# Patient Record
Sex: Female | Born: 1955 | Race: White | Hispanic: No | Smoking: Never smoker
Health system: Southern US, Community
[De-identification: ages and names within clinical notes are randomized; demographics above are authoritative.]

## PROBLEM LIST (undated history)

## (undated) DIAGNOSIS — K635 Polyp of colon: Secondary | ICD-10-CM

## (undated) DIAGNOSIS — J309 Allergic rhinitis, unspecified: Secondary | ICD-10-CM

## (undated) DIAGNOSIS — N814 Uterovaginal prolapse, unspecified: Secondary | ICD-10-CM

## (undated) DIAGNOSIS — R928 Other abnormal and inconclusive findings on diagnostic imaging of breast: Secondary | ICD-10-CM

## (undated) DIAGNOSIS — E7800 Pure hypercholesterolemia, unspecified: Secondary | ICD-10-CM

## (undated) DIAGNOSIS — R1031 Right lower quadrant pain: Secondary | ICD-10-CM

## (undated) DIAGNOSIS — K649 Unspecified hemorrhoids: Secondary | ICD-10-CM

## (undated) DIAGNOSIS — E041 Nontoxic single thyroid nodule: Secondary | ICD-10-CM

## (undated) DIAGNOSIS — N3020 Other chronic cystitis without hematuria: Secondary | ICD-10-CM

## (undated) DIAGNOSIS — M8580 Other specified disorders of bone density and structure, unspecified site: Secondary | ICD-10-CM

## (undated) DIAGNOSIS — N94819 Vulvodynia, unspecified: Secondary | ICD-10-CM

## (undated) HISTORY — PX: TUBAL LIGATION: SHX77

## (undated) HISTORY — DX: Other specified disorders of bone density and structure, unspecified site: M85.80

## (undated) HISTORY — DX: Other abnormal and inconclusive findings on diagnostic imaging of breast: R92.8

## (undated) HISTORY — DX: Nontoxic single thyroid nodule: E04.1

## (undated) HISTORY — DX: Pure hypercholesterolemia, unspecified: E78.00

## (undated) HISTORY — DX: Polyp of colon: K63.5

## (undated) HISTORY — DX: Vulvodynia, unspecified: N94.819

## (undated) HISTORY — DX: Allergic rhinitis, unspecified: J30.9

## (undated) HISTORY — PX: TOTAL ABDOMINAL HYSTERECTOMY: SHX209

## (undated) HISTORY — DX: Right lower quadrant pain: R10.31

## (undated) HISTORY — DX: Uterovaginal prolapse, unspecified: N81.4

## (undated) HISTORY — DX: Other chronic cystitis without hematuria: N30.20

## (undated) HISTORY — DX: Unspecified hemorrhoids: K64.9

---

## 1987-03-23 HISTORY — PX: ABDOMINAL HYSTERECTOMY: SHX81

## 1999-03-06 ENCOUNTER — Other Ambulatory Visit: Admission: RE | Admit: 1999-03-06 | Discharge: 1999-03-06 | Payer: Self-pay | Admitting: Obstetrics and Gynecology

## 1999-03-23 HISTORY — PX: RECTOVAGINAL FISTULA CLOSURE: SUR265

## 1999-07-06 ENCOUNTER — Encounter: Admission: RE | Admit: 1999-07-06 | Discharge: 1999-07-06 | Payer: Self-pay | Admitting: Obstetrics and Gynecology

## 1999-07-06 ENCOUNTER — Encounter: Payer: Self-pay | Admitting: Obstetrics and Gynecology

## 1999-09-11 ENCOUNTER — Encounter: Admission: RE | Admit: 1999-09-11 | Discharge: 1999-09-11 | Payer: Self-pay | Admitting: Gastroenterology

## 1999-09-11 ENCOUNTER — Encounter: Payer: Self-pay | Admitting: Gastroenterology

## 1999-09-21 ENCOUNTER — Encounter: Payer: Self-pay | Admitting: Gastroenterology

## 1999-09-21 ENCOUNTER — Encounter: Admission: RE | Admit: 1999-09-21 | Discharge: 1999-09-21 | Payer: Self-pay | Admitting: Gastroenterology

## 1999-11-11 ENCOUNTER — Ambulatory Visit (HOSPITAL_COMMUNITY): Admission: RE | Admit: 1999-11-11 | Discharge: 1999-11-11 | Payer: Self-pay | Admitting: Gastroenterology

## 1999-11-20 ENCOUNTER — Encounter: Admission: RE | Admit: 1999-11-20 | Discharge: 1999-11-20 | Payer: Self-pay | Admitting: Internal Medicine

## 1999-11-20 ENCOUNTER — Encounter: Payer: Self-pay | Admitting: Internal Medicine

## 1999-12-24 ENCOUNTER — Encounter (INDEPENDENT_AMBULATORY_CARE_PROVIDER_SITE_OTHER): Payer: Self-pay | Admitting: Specialist

## 1999-12-24 ENCOUNTER — Observation Stay (HOSPITAL_COMMUNITY): Admission: RE | Admit: 1999-12-24 | Discharge: 1999-12-25 | Payer: Self-pay | Admitting: General Surgery

## 2000-09-01 ENCOUNTER — Encounter: Admission: RE | Admit: 2000-09-01 | Discharge: 2000-09-01 | Payer: Self-pay | Admitting: Obstetrics and Gynecology

## 2000-09-01 ENCOUNTER — Encounter: Payer: Self-pay | Admitting: Obstetrics and Gynecology

## 2000-10-06 ENCOUNTER — Other Ambulatory Visit: Admission: RE | Admit: 2000-10-06 | Discharge: 2000-10-06 | Payer: Self-pay | Admitting: Obstetrics and Gynecology

## 2000-11-02 ENCOUNTER — Ambulatory Visit (HOSPITAL_COMMUNITY): Admission: RE | Admit: 2000-11-02 | Discharge: 2000-11-02 | Payer: Self-pay | Admitting: *Deleted

## 2000-11-04 ENCOUNTER — Ambulatory Visit (HOSPITAL_COMMUNITY): Admission: RE | Admit: 2000-11-04 | Discharge: 2000-11-04 | Payer: Self-pay | Admitting: *Deleted

## 2000-11-04 ENCOUNTER — Encounter: Payer: Self-pay | Admitting: *Deleted

## 2001-10-30 ENCOUNTER — Encounter: Payer: Self-pay | Admitting: Urology

## 2001-10-30 ENCOUNTER — Encounter: Admission: RE | Admit: 2001-10-30 | Discharge: 2001-10-30 | Payer: Self-pay | Admitting: Urology

## 2002-11-07 ENCOUNTER — Encounter: Admission: RE | Admit: 2002-11-07 | Discharge: 2002-11-07 | Payer: Self-pay | Admitting: Obstetrics and Gynecology

## 2002-11-07 ENCOUNTER — Encounter: Payer: Self-pay | Admitting: Obstetrics and Gynecology

## 2004-03-04 ENCOUNTER — Other Ambulatory Visit: Admission: RE | Admit: 2004-03-04 | Discharge: 2004-03-04 | Payer: Self-pay | Admitting: Obstetrics and Gynecology

## 2004-06-11 ENCOUNTER — Observation Stay (HOSPITAL_COMMUNITY): Admission: AD | Admit: 2004-06-11 | Discharge: 2004-06-12 | Payer: Self-pay | Admitting: *Deleted

## 2004-06-19 ENCOUNTER — Encounter (INDEPENDENT_AMBULATORY_CARE_PROVIDER_SITE_OTHER): Payer: Self-pay | Admitting: *Deleted

## 2004-06-19 ENCOUNTER — Ambulatory Visit (HOSPITAL_COMMUNITY): Admission: RE | Admit: 2004-06-19 | Discharge: 2004-06-19 | Payer: Self-pay | Admitting: *Deleted

## 2004-06-24 ENCOUNTER — Encounter: Admission: RE | Admit: 2004-06-24 | Discharge: 2004-06-24 | Payer: Self-pay | Admitting: *Deleted

## 2004-08-21 ENCOUNTER — Other Ambulatory Visit: Admission: RE | Admit: 2004-08-21 | Discharge: 2004-08-21 | Payer: Self-pay | Admitting: Addiction Medicine

## 2005-03-10 ENCOUNTER — Encounter: Admission: RE | Admit: 2005-03-10 | Discharge: 2005-03-10 | Payer: Self-pay | Admitting: Obstetrics and Gynecology

## 2005-04-19 ENCOUNTER — Other Ambulatory Visit: Admission: RE | Admit: 2005-04-19 | Discharge: 2005-04-19 | Payer: Self-pay | Admitting: Obstetrics and Gynecology

## 2005-10-06 ENCOUNTER — Other Ambulatory Visit: Admission: RE | Admit: 2005-10-06 | Discharge: 2005-10-06 | Payer: Self-pay | Admitting: Obstetrics and Gynecology

## 2006-03-28 ENCOUNTER — Encounter: Admission: RE | Admit: 2006-03-28 | Discharge: 2006-03-28 | Payer: Self-pay | Admitting: Obstetrics and Gynecology

## 2006-04-27 ENCOUNTER — Other Ambulatory Visit: Admission: RE | Admit: 2006-04-27 | Discharge: 2006-04-27 | Payer: Self-pay | Admitting: Obstetrics and Gynecology

## 2007-04-12 ENCOUNTER — Encounter: Admission: RE | Admit: 2007-04-12 | Discharge: 2007-04-12 | Payer: Self-pay | Admitting: Obstetrics and Gynecology

## 2007-05-31 ENCOUNTER — Other Ambulatory Visit: Admission: RE | Admit: 2007-05-31 | Discharge: 2007-05-31 | Payer: Self-pay | Admitting: Obstetrics and Gynecology

## 2008-02-29 ENCOUNTER — Ambulatory Visit: Payer: Self-pay | Admitting: Obstetrics and Gynecology

## 2008-03-01 ENCOUNTER — Ambulatory Visit: Payer: Self-pay | Admitting: Obstetrics and Gynecology

## 2008-04-26 ENCOUNTER — Ambulatory Visit: Payer: Self-pay | Admitting: Obstetrics and Gynecology

## 2008-04-29 ENCOUNTER — Encounter: Admission: RE | Admit: 2008-04-29 | Discharge: 2008-04-29 | Payer: Self-pay | Admitting: General Surgery

## 2008-05-02 ENCOUNTER — Ambulatory Visit (HOSPITAL_BASED_OUTPATIENT_CLINIC_OR_DEPARTMENT_OTHER): Admission: RE | Admit: 2008-05-02 | Discharge: 2008-05-03 | Payer: Self-pay | Admitting: General Surgery

## 2008-05-02 ENCOUNTER — Ambulatory Visit: Payer: Self-pay | Admitting: Obstetrics and Gynecology

## 2008-05-02 ENCOUNTER — Encounter (INDEPENDENT_AMBULATORY_CARE_PROVIDER_SITE_OTHER): Payer: Self-pay | Admitting: General Surgery

## 2008-05-02 HISTORY — PX: APPENDECTOMY: SHX54

## 2008-05-02 HISTORY — PX: PELVIC LAPAROSCOPY: SHX162

## 2008-05-24 ENCOUNTER — Ambulatory Visit: Payer: Self-pay | Admitting: Obstetrics and Gynecology

## 2008-05-31 ENCOUNTER — Encounter: Admission: RE | Admit: 2008-05-31 | Discharge: 2008-05-31 | Payer: Self-pay | Admitting: Obstetrics and Gynecology

## 2008-06-07 ENCOUNTER — Encounter: Admission: RE | Admit: 2008-06-07 | Discharge: 2008-06-07 | Payer: Self-pay | Admitting: Obstetrics and Gynecology

## 2008-08-02 ENCOUNTER — Other Ambulatory Visit: Admission: RE | Admit: 2008-08-02 | Discharge: 2008-08-02 | Payer: Self-pay | Admitting: Obstetrics and Gynecology

## 2008-08-02 ENCOUNTER — Ambulatory Visit: Payer: Self-pay | Admitting: Obstetrics and Gynecology

## 2008-08-02 ENCOUNTER — Encounter: Payer: Self-pay | Admitting: Obstetrics and Gynecology

## 2008-08-07 ENCOUNTER — Ambulatory Visit: Payer: Self-pay | Admitting: Obstetrics and Gynecology

## 2008-08-26 ENCOUNTER — Ambulatory Visit: Payer: Self-pay | Admitting: Obstetrics and Gynecology

## 2008-08-30 ENCOUNTER — Ambulatory Visit: Payer: Self-pay | Admitting: Obstetrics and Gynecology

## 2008-11-29 ENCOUNTER — Ambulatory Visit: Payer: Self-pay | Admitting: Obstetrics and Gynecology

## 2008-12-11 ENCOUNTER — Ambulatory Visit: Payer: Self-pay | Admitting: Obstetrics and Gynecology

## 2008-12-23 ENCOUNTER — Ambulatory Visit: Payer: Self-pay | Admitting: Obstetrics and Gynecology

## 2009-04-28 ENCOUNTER — Ambulatory Visit: Payer: Self-pay | Admitting: Obstetrics and Gynecology

## 2009-05-05 ENCOUNTER — Ambulatory Visit: Payer: Self-pay | Admitting: Gynecology

## 2009-06-13 ENCOUNTER — Encounter: Admission: RE | Admit: 2009-06-13 | Discharge: 2009-06-13 | Payer: Self-pay | Admitting: Obstetrics and Gynecology

## 2009-09-08 ENCOUNTER — Ambulatory Visit: Payer: Self-pay | Admitting: Obstetrics and Gynecology

## 2009-09-08 ENCOUNTER — Other Ambulatory Visit: Admission: RE | Admit: 2009-09-08 | Discharge: 2009-09-08 | Payer: Self-pay | Admitting: Obstetrics and Gynecology

## 2010-04-12 ENCOUNTER — Encounter: Payer: Self-pay | Admitting: Obstetrics and Gynecology

## 2010-05-27 ENCOUNTER — Other Ambulatory Visit: Payer: Self-pay | Admitting: Internal Medicine

## 2010-05-27 ENCOUNTER — Ambulatory Visit
Admission: RE | Admit: 2010-05-27 | Discharge: 2010-05-27 | Disposition: A | Discharge status: Home / Self Care | Payer: 59 | Source: Ambulatory Visit | Attending: Internal Medicine | Admitting: Internal Medicine

## 2010-05-27 DIAGNOSIS — J329 Chronic sinusitis, unspecified: Secondary | ICD-10-CM

## 2010-06-12 ENCOUNTER — Other Ambulatory Visit: Payer: Self-pay | Admitting: Obstetrics and Gynecology

## 2010-06-12 DIAGNOSIS — Z1231 Encounter for screening mammogram for malignant neoplasm of breast: Secondary | ICD-10-CM

## 2010-06-26 ENCOUNTER — Ambulatory Visit
Admission: RE | Admit: 2010-06-26 | Discharge: 2010-06-26 | Disposition: A | Discharge status: Home / Self Care | Payer: 59 | Source: Ambulatory Visit | Attending: Obstetrics and Gynecology | Admitting: Obstetrics and Gynecology

## 2010-06-26 DIAGNOSIS — Z1231 Encounter for screening mammogram for malignant neoplasm of breast: Secondary | ICD-10-CM

## 2010-07-07 LAB — URINALYSIS, ROUTINE W REFLEX MICROSCOPIC
Bilirubin Urine: NEGATIVE
Glucose, UA: NEGATIVE mg/dL
Hgb urine dipstick: NEGATIVE
Ketones, ur: NEGATIVE mg/dL
Nitrite: NEGATIVE
Protein, ur: NEGATIVE mg/dL
Specific Gravity, Urine: 1.024 (ref 1.005–1.030)
Urobilinogen, UA: 0.2 mg/dL (ref 0.0–1.0)
pH: 5.5 (ref 5.0–8.0)

## 2010-07-07 LAB — DIFFERENTIAL
Basophils Absolute: 0 10*3/uL (ref 0.0–0.1)
Basophils Relative: 0 % (ref 0–1)
Eosinophils Absolute: 0.1 10*3/uL (ref 0.0–0.7)
Eosinophils Relative: 1 % (ref 0–5)
Lymphocytes Relative: 32 % (ref 12–46)
Lymphs Abs: 3.5 10*3/uL (ref 0.7–4.0)
Monocytes Absolute: 0.6 10*3/uL (ref 0.1–1.0)
Monocytes Relative: 6 % (ref 3–12)
Neutro Abs: 6.7 10*3/uL (ref 1.7–7.7)
Neutrophils Relative %: 61 % (ref 43–77)

## 2010-07-07 LAB — CBC
HCT: 41.7 % (ref 36.0–46.0)
Hemoglobin: 13.9 g/dL (ref 12.0–15.0)
MCHC: 33.5 g/dL (ref 30.0–36.0)
MCV: 91.7 fL (ref 78.0–100.0)
Platelets: 318 10*3/uL (ref 150–400)
RBC: 4.54 MIL/uL (ref 3.87–5.11)
RDW: 12.1 % (ref 11.5–15.5)
WBC: 11 10*3/uL — ABNORMAL HIGH (ref 4.0–10.5)

## 2010-07-07 LAB — COMPREHENSIVE METABOLIC PANEL
ALT: 14 U/L (ref 0–35)
AST: 19 U/L (ref 0–37)
Albumin: 4.3 g/dL (ref 3.5–5.2)
Alkaline Phosphatase: 35 U/L — ABNORMAL LOW (ref 39–117)
BUN: 10 mg/dL (ref 6–23)
CO2: 28 mEq/L (ref 19–32)
Calcium: 9.7 mg/dL (ref 8.4–10.5)
Chloride: 107 mEq/L (ref 96–112)
Creatinine, Ser: 0.64 mg/dL (ref 0.4–1.2)
GFR calc Af Amer: >60 mL/min (ref >60)
GFR calc non Af Amer: >60 mL/min (ref >60)
Glucose, Bld: 90 mg/dL (ref 70–99)
Potassium: 3.6 mEq/L (ref 3.5–5.1)
Sodium: 140 mEq/L (ref 135–145)
Total Bilirubin: 0.6 mg/dL (ref 0.3–1.2)
Total Protein: 7.2 g/dL (ref 6.0–8.3)

## 2010-08-04 NOTE — Op Note (Signed)
[name], [name]             ACCOUNT NO.:  [id number]   MEDICAL RECORD NO.:  [id number]          PATIENT TYPE:  AMB   LOCATION:  [facility]                         FACILITY:  [facility]   PHYSICIAN:  Daniel L. Gottsegen, M.D.DATE OF BIRTH:  [birth year]   DATE OF PROCEDURE:  05/02/2008  DATE OF DISCHARGE:                               OPERATIVE REPORT   PREOPERATIVE DIAGNOSIS:  Right lower quadrant pain.   POSTOPERATIVE DIAGNOSES:  1. Right lower quadrant pain.  2. Ovarian cyst.  3. 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   OPERATION:  1. Diagnostic laparoscopy.  2. Appendectomy.  3. Right salpingo-oophorectomy.  4. Excision of adhesion with small cyst surgeons.   SURGEONS:  1. Dr. Eda Paschal.  2. Dr. Abbey Chatters.   INDICATIONS:  The patient is a 55 year old female who had presented to  the office with intermittent, but persistent right lower quadrant pain  over a period of several years.  Ultrasound revealed a small cyst on her  right ovary.  CT scan did not help elucidate the cause of the pain.  The  plan was to take her to the operating room in conjunction with Dr.  Abbey Chatters to rule out all sources of right lower quadrant pain.   FINDINGS:  The patient's left ovary and tube were basically normal,  except for some paratubal cysts on the left tube.  The right ovary was  enlarged by a small ovarian cyst that did not appear to be malignant.  There were no adhesions involving either ovary or tube.  There was,  however, some adhesive disease in the right lower quadrant above the  round ligament associated with a small cyst.  The rest of the findings  are dictated in Dr. Maris Berger note.   DESCRIPTION OF PROCEDURE:  The patient had been taken to the operating  room and prepped and draped in the usual sterile manner in the dorsal  supine position.  A Foley catheter had been inserted into her bladder.  The laparoscopy was started by Dr.  Abbey Chatters and is dictated in his  note.  Once we finished exploring, we elected to take out her right  ovary and tube first.  Peritoneal washings were obtained.  The patient  had three incisions, a 12 mm incision subumbilically, a 5-mm port the  right lower quadrant and a 5-mm port in the left lower quadrant.  The  right adnexa was elevated.  The ureter was identified.  The  infundibulopelvic ligament was bipolared and cut.  All attachments of  the ovary and tube to the broad ligament were bipolared and cut until  the ovary and tube were free.  The adhesion described above in the right  lower quadrant above the round ligament was elevated.  It was excised  with the harmonic scalpel which had been used for the appendectomy and  care was taken to take the cyst as well.  We are going to look for  endometriosis in this specimen as the patient had adenomyosis the time  of her  hysterectomy.  Along with the appendix,  everything was removed through  the Endopouch.  The rest of the procedure is dictated by Dr. Abbey Chatters.  The patient left the operating room in satisfactory condition.  Blood  loss for the procedure was minimal      Daniel L. Eda Paschal, M.D.  Electronically Signed     DLG/MEDQ  D:  05/02/2008  T:  05/02/2008  Job:  [id number]

## 2010-08-04 NOTE — Op Note (Signed)
[name], [name]             ACCOUNT NO.:  [id number]   MEDICAL RECORD NO.:  [id number]          PATIENT TYPE:  AMB   LOCATION:  [facility]                         FACILITY:  [facility]   PHYSICIAN:  Adolph Pollack, M.D.DATE OF BIRTH:  03-04-1956   DATE OF PROCEDURE:  05/02/2008  DATE OF DISCHARGE:                               OPERATIVE REPORT   PREOPERATIVE DIAGNOSIS:  Chronic right lower quadrant abdominal pain and  right ovarian cyst.   POSTOPERATIVE DIAGNOSIS:  Chronic right lower quadrant abdominal pain  and right ovarian cyst, cystic right lower quadrant adhesion.   PROCEDURE:  1. Diagnostic laparoscopy.  2. Laparoscopic right salpingo-oophorectomy (by Dr. Eda Paschal).  3. Laparoscopic appendectomy.  4. Excision of cystic right lower quadrant adhesion.   SURGEON:  Adolph Pollack, M.D.   Mammie LorenzoRande Brunt. Eda Paschal, M.D.   ANESTHESIA:  General.   INDICATIONS:  This 55 year old female has been having chronic sharp  right lower quadrant pain, somewhat debilitating at times.  She has had  a very thorough workup.  She has had CT scans showing 2 tiny stones in  the right kidney but without an explanation for her pain, and the  appendix was not visualized.  There is a small 2-cm right ovarian cyst  that was being followed by Dr. Eda Paschal.  She has a history of chronic  urinary tract infections but is on suppressive antibiotic therapy.  After long discussion with her, it was decided to proceed with  diagnostic laparoscopy as well as a right salpingo-oophorectomy and  appendectomy.   TECHNIQUE:  She was seen in the holding area and then brought to the  operating room, placed supine on the operating table and a general  anesthetic was administered.  She was placed in the lithotomy position.  The abdominal area and perineal area were sterilely prepped and draped  and a Foley was placed.   Marcaine solution was infiltrated in the subumbilical region.  A  subumbilical  incision was made through the skin, subcutaneous tissue,  fascia and peritoneum, entering the peritoneal cavity under direct  vision.  A pursestring suture of 0 Vicryl was placed around the fascial  edges.  A Hasson trocar was introduced into the peritoneal cavity and  pneumoperitoneum created by insufflation of CO2 gas.   Following this, the laparoscope was introduced.  She subsequently was  placed in a Trendelenburg position.  A 5-mm trocar was placed in the  left lower quadrant area and one in the right lower quadrant.  The left  tube and ovary were evaluated by Dr. Eda Paschal and the right tube and  ovary were evaluated and the cyst identified.  We identified the  appendix, which did not appear to have any acute inflammatory response.  There was a cystic adhesion noted in the right lower quadrant abdominal  wall region.   Dr. Eda Paschal then proceeded with obtaining pelvic washings and  performing a right salpingo-oophorectomy laparoscopically, which will be  dictated in his note.  Following the right salpingo-oophorectomy, I  grasped the mesoappendix and retracted it anteriorly.  Using harmonic  scalpel, the mesoappendix was divided near  its base with the cecum.  Using the endoscopic linear cutting stapler,  I amputated the appendix  off the cecum.  The ovary and tube had been placed in the cul-de-sac and  the appendix was then placed in the cul-de-sac.  We noticed the cystic  right lower quadrant adhesion, which was excised with sharp dissection  and using the Harmonic scalpel.  It was removed and sent for specimen.  We subsequently placed the ovary and the appendix in an Endopouch bag  and removed them through the subumbilical port.  Of note was that there  had been some bleeding from the staple line where the appendectomy was  performed, which was controlled with a hemoclip.   Following this, and after copious irrigation, all areas were inspected  and there was no bleeding noted.   I had run the small bowel from the  ileocecal valve distally and no Meckel's diverticulum was noted.  There  were no cecal diverticulum noted and no sigmoid diverticulum noted.  The  gallbladder appeared to be normal, as did the liver.   The evacuation fluid was evacuated as much as possible.  Hemostasis was  adequate.  The trocars were removed and pneumoperitoneum was released.  The subumbilical fascial defect was closed by tightening up and tying  down the pursestring suture.  Skin incisions were closed with 4-0  Monocryl subcuticular stitches, followed by Steri-Strips and sterile  dressings.   She tolerated the procedure well without any apparent complications and  was taken to the recovery room in satisfactory condition.      Adolph Pollack, M.D.  Electronically Signed     TJR/MEDQ  D:  05/02/2008  T:  05/02/2008  Job:  [id number]   cc:   Reuel Boom L. Eda Paschal, M.D.  Fax: [phone]   Soyla Murphy. Renne Crigler, M.D.  Fax: [phone]

## 2010-08-07 NOTE — H&P (Signed)
NAMEGENEVRA, ORNE             ACCOUNT NO.:  [id number]   MEDICAL RECORD NO.:  [id number]          PATIENT TYPE:  INP   LOCATION:  5506                         FACILITY:  [facility]   PHYSICIAN:  [name], D.O.    DATE OF BIRTH:  [birth year]   DATE OF ADMISSION:  06/11/2004  DATE OF DISCHARGE:                                HISTORY & PHYSICAL   PRIMARY CARE PHYSICIAN:  Dr. Juleen China   HISTORY OF PRESENT ILLNESS:  [name] is a pleasant 55 year old  Caucasian female with a past medical history significant for previous  rectovaginal fistula in 2001 seen by Dr. Loreta Ave and eventually Dr. Abbey Chatters  and subsequently surgeon in [place] for which she underwent repair.  Has  not had problems since that time.  She had been in her usual state of health  until this past evening around 11:00 prior to bed around 11 she stated she  developed some right lower quadrant pain that got worse throughout the  night.  She was unable to sleep.  She had no nausea, vomiting, or diarrhea.  She thought this would pass.  However, this morning this persisted and she  underwent evaluation by her primary care physicians and underwent blood work  including a urinalysis and a CBC.  Her white count was 9.6 at the time and  her urinalysis was unremarkable.  She did have a normal sedimentation rate  at the time as well; it was 3.  In any event, she was directed to [facility]  imaging center for a CT scan for which the findings were as follows:   1.Circumferential wall thickening of the distal ileum with mild surrounding  increased density of fatty tissues and small fluid within the cul-de-sac.  These findings suggest acute inflammatory process in absence of  visualization of a normal appendix, consider acute appendicitis.  Findings  are nonspecific and other inflammatory processes such as Crohn's disease or  PID should be considered as well.  1.  Presumed small right ovarian cyst and status post hysterectomy and left      ovary was not identified.   She is admitted directly to 5500 for further evaluation and management.   PAST MEDICAL HISTORY:  1.  She has a history of recurrent urinary tract infections.  Is on chronic      suppressive therapy by Dr. Logan Bores.  2.  Status post transvaginal hysterectomy in 1998.  She continues to follow      with a gynecologist.  In fact, underwent Pap smear in January 2006 for      which there were abnormal findings and she subsequent underwent biopsy      for which she states she will follow up in six months with a repeat.      She states that she retains her ovaries as well as her appendix.  She is      G3, P3 status post abdominal hysterectomy.  3.  Rectovaginal fistula in 2001 as noted above.  4.  Recurrent urinary tract infections.   CURRENT MEDICATIONS:  Macrodantin 50 mg daily as suppressive therapy.   ALLERGIES:  She has no known drug allergies.   SOCIAL HISTORY:  She denies alcohol or tobacco.  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   FAMILY HISTORY:  Her mother died at age 78 with renal carcinoma.  Father  died at age 12 with lung cancer.  She is the only child and has three  children who are alive and well.   REVIEW OF SYSTEMS:  She states she has no nausea or vomiting.  She had some  diarrhea today after the contrast study.  She had no chest pain or shortness  of breath.  No dizziness or lightheadedness.  No vaginal discharge or  dysuria.  No swelling of her lower extremities.  No recent travel.   PHYSICAL EXAMINATION:  VITAL SIGNS:  Blood pressure 95/65, temperature 97.4,  heart rate 74, respirations 18, O2 saturation 100% on room air.  GENERAL:  Patient is alert.  No acute distress.  Answering questions  appropriately.  HEENT:  Head is normocephalic, atraumatic.  Extraocular muscles are intact.  NECK:  Supple, nontender.  No palpable mass or thyromegaly.  CARDIOVASCULAR:  Normal S1, S2.  No  murmurs, rubs, or gallops.  PULMONARY:  Lungs clear bilaterally.  Normal effort.  No dullness to  percussion.  ABDOMEN:  Soft without rebound.  However, she does have point tenderness in  her right lower quadrant.  She has no right upper quadrant tenderness and no  suprapubic or costovertebral angle tenderness.  Her bowel sounds are normo  to hyperactive.  EXTREMITIES:  No edema, cyanosis, or clubbing.  Peripheral pulses are  palpable.  NEUROLOGIC:  Euthymic.  Affect is stable.   LABORATORY DATA:  These are obtained from [facility]  including a urinalysis that is unremarkable.  Laboratory data here reveals  elevation in her WBC at 11.1, hemoglobin 13.9, platelet count 355, MCV 86.  She did have a CBC earlier today with a WBC of 9.6.  Comprehensive metabolic  panel reveals a sodium of 135, potassium of 3.4, chloride of 106, CO2 24,  glucose of 79, BUN of 5, creatinine 0.7.  Total bilirubin 1.1, alkaline  phosphatase 36, AST/ALT 18/9, albumin 4.3, calcium 9.0.   ASSESSMENT:  1.  Acute right lower quadrant pain.  2.  Elevated WBC.  3.  History of rectovaginal fistula in the past.  4.  Abnormal Pap smear in the past six months.  5.  Hypokalemia.   PLAN:  We are going to continue IV fluids and continue her on a clear liquid  diet, supplement her potassium and follow her course clinically.  I will  review the CT scans with our radiologist here, perhaps repeat CT scan.  I  have  discussed the case with Dr. Ezzard Standing who has recommended that I review these  films with our radiologist. Will  await further input from Dr. Ezzard Standing.  With  her previous history of rectovaginal fistula Crohn's is certainly a  consideration.      ESS/MEDQ  D:  06/11/2004  T:  06/12/2004  Job:  [id number]   cc:   Brooke Bonito, M.D.  [address]  [place]  [place] [address]  Fax: [phone]

## 2010-08-07 NOTE — Procedures (Signed)
[facility]  Patient:    [name], [name]                    MRN: [id number] Proc. Date: 11/12/99 Adm. Date:  [id number] Disc. Date: [id number] Attending:  Charna Elizabeth CC:         Rande Brunt. Eda Paschal, M.D.   Procedure Report  DATE OF BIRTH:  [birth year]  PROCEDURE PERFORMED:  Flexible sigmoidoscopy.  ENDOSCOPIST:  Anselmo Rod, M.D.  INSTRUMENT USED:  Olympus video colonoscope.  INDICATION FOR PROCEDURE:  Leakage of stools through the vagina in a 55 year old white female, with essentially unrevealing barium enema and a CT. Flexible sigmoidoscopy has been planned to evaluate for fistulous disease around the inner rectal area.  PREPROCEDURE PREPARATION:  Informed consent was procured from the patient. The patient was fasted for eight hours prior to the procedure and prepped with two Fleets enemas the morning of the procedure.  PREPROCEDURE PHYSICAL:  VITAL SIGNS:  The patient has stable vital signs. NECK:  Supple.  CHEST:  Clear to auscultation.  HEART:  S1, S2 regular. ABDOMEN:  Soft, with normal abdominal bowel sounds.  DESCRIPTION OF PROCEDURE:  The patient was placed in the left lateral decubitus position.  No sedation was used.  Once the patient was adequately positioned, the Olympus video colonoscope was advanced from the rectum to about 50 cm without difficulty.  The entire colonic mucosa from the rectum to the mid left colon appeared healthy.  A small polyp was seen at the anal verge.  This was not removed because of the risk of bleeding.  The patient also had small internal hemorrhoids on retroflexion.  He tolerated the procedure well without complications.  IMPRESSION: 1.  Small nonbleeding internal hemorrhoids. 2.  Polyp at anal verge. 3.  No fistulous disease or inflammatory bowel disease noted in the inner     rectal area or the left colon.  RECOMMENDATION: 1.  The patient will be advised to see Dr. Abbey Chatters for an  excision of the     polyp at the anal verge. 2.  She is to follow up with Dr. Edyth Gunnels for further evaluation of     what may be a rectovaginal fistula. DD:  11/12/99 TD:  11/12/99 Job: [id number]

## 2010-08-07 NOTE — Consult Note (Signed)
[name], [name] NO.:  [id number]   MEDICAL RECORD NO.:  [id number]          PATIENT TYPE:  INP   LOCATION:  5506                         FACILITY:  [facility]   PHYSICIAN:  Georgiana Spinner, M.D.    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  She is a 55 year old  lady whom I have been asked to see for evaluation of acute abdominal pain.  The patient states she was in her usual state of good health until last  evening when she developed the onset of right lower quadrant abdominal pain  and chills.  There was no fever.  The pain persisted until today.  There has  been no vomiting.  She did have diarrhea after she got contrast for a CT  scan.  She states that prior to this she was having no abdominal pain.  She  was well, without complaints.  No dysuria, no hematuria, no blood in her  stools, and her bowel movements have been normal, formed, once a day.  Her  weight had been stable.  She has had endoscopy by me in the past for what  sounded like reflux symptomatology, and of note she has had a rectovaginal  fistula repair back in 2001 by Avel Peace, M.D., and had colonoscopy  and flexible sigmoidoscopy at that time by Dr. Loreta Ave, and they did not find  any inflammatory process.  She has had a partial hysterectomy.  Ovaries were  left intact, and as far as she knows the appendix is still in.  She has  chronic urinary tract infections, for which she sees Marcelyn Bruins, M.D., of  urology.  She takes Macrodantin on a regular basis.  She does not smoke,  does not drink.  She has had a negative Cardiolite about four or five years  ago, with an essentially normal 2D echocardiogram in 2002.   Currently, on my examination:  WEIGHT:  102.  BLOOD PRESSURE:  106/58.  TEMPERATURE:  When recorded earlier this morning, was 98.  HEENT:   Without jaundice.  NECK:  Thyroid not enlarged.  No bruits in her neck.  LUNGS:  Clear.  HEART:  Regular rhythm, without murmurs, gallops, or rubs.  ABDOMEN:  Benign, except for tenderness in the right lower quadrant.   IMPRESSION:  A CT scan showed inflammatory changes of the distal ileum and  could be consistent with appendicitis.  Could be inflammatory bowel disease,  that is, Crohn's.  Given the acute onset of the pain and the chills, I think  a surgical consultation is certainly warranted, and I have called surgery to  see her.  This would not be a typical presentation for Crohn's disease to  appear so acutely, but given the fact that she had had an unknown cause for  a rectovaginal fistula in the past, certainly that suspicion and concern has  to be addressed.  At this point, I will follow with the  surgeons, and  consideration at some point for colonoscopy unless clinically it points away  from Crohn's disease.      GMO/MEDQ  D:  06/11/2004  T:  06/12/2004  Job:  [id number]

## 2010-08-07 NOTE — Consult Note (Signed)
[name], [name]             ACCOUNT NO.:  [id number]   MEDICAL RECORD NO.:  [id number]          PATIENT TYPE:  INP   LOCATION:  5506                         FACILITY:  [facility]   PHYSICIAN:  Sandria Bales. Ezzard Standing, M.D.  DATE OF BIRTH:  [birth year]   DATE OF CONSULTATION:  06/12/2004  DATE OF DISCHARGE:                                   CONSULTATION   HISTORY OF PRESENT ILLNESS:  This is a 55 year old white female who is a  patient of Dr. Soyla Murphy. 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 Pharr also. She developed  sudden onset of abdominal pain which she described in her lower abdomen,  localizing to the right side last evening at about 11 p.m. She had trouble  finding any comfort last night, saw Vangie Bicker, P.A., who is Dr.  Soyla Murphy. Pharr's P.A., this morning. She then obtained a CT scan on her and  came back to see Dr. Melrose Nakayama. Virginia Rochester to review the CT scan. The CT scan was  done at [facility].  The CT scan showed no obvious appendix. There was a  thickening of the terminal ileum and a suggestion of a cyst of the right  ovary. Dr. Melrose Nakayama. Virginia Rochester spoke with Dr. Lebron Conners around 4:30.  Unfortunately, [name] did not get admitted up until later this  evening.   Dr. Hettie Holstein, who is [facility], has admitted her for [facility]. He called me this evening. I have been tied up in the operating  room and I am seeing [name] at about 1 a.m. on June 12, 2004.   [name] has had no chronic GI complaints or problems. She has had no  vomiting with this pain. She has had some mild chills. She has no history of  Crohn's disease.  Her prior abdominal surgery has included a Cesarean  section in 1978, cesarean section in 1982, cesarean section in 1985, and  then she underwent a vaginal hysterectomy in 1998 for dysplasia by Dr.  Rande Brunt. Gottsegen. She saw Dr. 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 Dr. Adolph Pollack repaired the rectovaginal fistula, though it  is unclear to me by reading his note how well he saw it, but the  rectovaginal fistula recurred. The patient was sent to [facility]. She did  not remember the name of the physician she saw in [place] but they  repaired the rectovaginal fistula down there.   ALLERGIES:  No allergies.   MEDICATIONS:  Macrodantin that she takes daily from Dr. Jamison Neighbor.   REVIEW OF SYMPTOMS:  NEUROLOGICAL:  She has had no history of seizure or  loss of consciousness.  PULMONARY:  No history of pneumonia or tuberculosis.  CARDIAC:  She has no chest pain or angina.  Urologic:  Dr. Marcelyn Bruins follows her for chronic bladder infections.  GASTROINTESTINAL:  Again, she has had an upper endoscopy by Dr. Georgiana Spinner. The  date that I have is August 2002.   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   PHYSICAL EXAMINATION:  VITAL SIGNS:  Temperature of 98.3, pulse 65,  respirations 18, blood pressure 106/52.  GENERAL:  She is a well-nourished, thin, pleasant white female alert and  cooperative on physical exam.  HEENT:  Unremarkable.  NECK:  Supple. I found no mass or thyromegaly.  LUNGS:  Clear to auscultation.  HEART:  Regular rate and rhythm. She is not tachycardic nor does she look  either flushed or hyperemic.  ABDOMEN:  Soft. She has bowel sounds which are present. She has some mild  soreness. Right lower quadrant suprapubic which is fairly nondescript but I  did not do a pelvic exam on her.  EXTREMITIES:  She has good strength in all four extremities.   LABORATORY DATA:  White blood count of 11,100, hemoglobin 13, hematocrit 39.  Her sodium was 135, potassium 3.4, chloride 106, CO2 24, glucose 79. Her  alkaline phosphate is low at 36. Her lipase is 22.   IMPRESSION:  1.  Right lower quadrant  suprapubic pain:  I reviewed her CT scan with Dr.      Mayra Neer. Eppie Gibson and it is a nonspecific CT scan.  The CT was done at [facility] and I left the films with Dr. Eppie Gibson. There was no evidence of      appendicitis. In fact, the appendix is not seen on the films.  She does      have this questionable thickening in the terminal ileum. I think you can      see the right ovary. I doubt that she has appendicitis. I do not think      she has a surgical abdomen.  She has no fever. She is not tachycardic.      She has normal bowel sounds and a basically unremarkable CT scan from my      standpoint. I suspect this may be something like a ruptured ovarian      cyst.  I plan to repeat the CBC in the morning.  If necessary a CT scan      can be repeated and she already has some contrast in her colon.  2.  Chronic bladder infections followed by Dr. Jamison Neighbor:  She is on      Macrodantin. She has been on this for about two years.  3.  Remote history of rectovaginal fistula repair in 2001:  As far as she      knows has had no further problems with.      DHN/MEDQ  D:  06/12/2004  T:  06/12/2004  Job:  [id number]   cc:   Soyla Murphy. Renne Crigler, M.D.  [address]  [place]  [place] [address]  Fax: [phone]   Georgiana Spinner, M.D.  [address]  [place]  [place] [address]  Fax: [phone]   Jamison Neighbor, M.D.  [address]  [place]  [place] [address]  Fax: [phone]   Rande Brunt. Eda Paschal, M.D.  [address]  [place]  [place] [address]  Fax: [phone]

## 2010-08-07 NOTE — Op Note (Signed)
[name], [name]             ACCOUNT NO.:  [id number]   MEDICAL RECORD NO.:  [id number]          PATIENT TYPE:  AMB   LOCATION:  ENDO                         FACILITY[facility]  [facility]   PHYSICIAN:  Georgiana Spinner, M.D.    DATE OF BIRTH:  10/12/1955   DATE OF PROCEDURE:  DATE OF DISCHARGE:                                 OPERATIVE REPORT   PROCEDURE:  Colonoscopy.   ENDOSCOPIST:  Georgiana Spinner, M.D.   INDICATIONS:  Colon polyps.   ANESTHESIA:  Demerol 60, Versed 7 mg.   DESCRIPTION OF PROCEDURE:  With the patient mildly sedated in the left  lateral decubitus position, the Olympus videoscopic colonoscope was inserted  in the rectum and passed under direct vision to the cecum, identified by  ileocecal valve and appendiceal orifice, both of which were photographed. We  explored the cecum.  There was an area that I thought might have been have  been the ileocecal valve or it could be polypoid tissue.  I could not tell.  I photographed this, and I biopsied it, tried to enter into the ileocecal  valve but after multiple attempts finally could not do this and elected,  therefore, to terminate the procedure.  The colonoscope was then slowly  withdrawn taking circumferential views of the colonic mucosa stopping in the  rectum which appeared normal on direct and showed hemorrhoids on retroflexed  view.  The endoscope was straightened and withdrawn through the anal canal.  Of note, there appeared to be some scar tissue in the rectum just above the  anal canal, but otherwise the endoscope was withdrawn without problem.  The  patient's vital signs and pulse oximeter remained stable.  The patient  tolerated the procedure well without apparent complications.   FINDINGS:  Question of polyp versus ileocecal valve in the area of the  cecum, internal hemorrhoids, otherwise unremarkable colonoscopic examination  to the cecum. The terminal ileum could not be viewed.   PLAN:  Consider GYN evaluation and  at some point a possible small bowel  series and possibly some blood tests as an outpatient.      GMO/MEDQ  D:  06/19/2004  T:  06/19/2004  Job:  [id number]   cc:   Reuel Boom L. Eda Paschal, M.D.  [address]  [place]  [place] [address]  Fax: [phone]

## 2010-08-07 NOTE — Op Note (Signed)
[facility]  Patient:    [name], [name]                    MRN: [id number] Proc. Date: 12/24/99 Adm. Date:  [id number] Attending:  Arlis Porta CC:         Anselmo Rod, M.D.  Daniel L. Eda Paschal, M.D.  Jenel Lucks, M.D.   Operative Report  PREOPERATIVE DIAGNOSIS: 1. Anal polyps. 2. Right posterior lateral hemorrhoid. 3. 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  POSTOPERATIVE DIAGNOSIS: 1. Anal polyp x 2. 2. Right posterior lateral hemorrhoid. 3. Rectovaginal fistula.  OPERATION: 1. Examination under anesthesia. 2. Anal polypectomy x 2 of proximal distal polyps. 3. Right posterior lateral hemorrhoidectomy. 4. Closure of rectovaginal fistula.  SURGEON:  Adolph Pollack, M.D.  ASSISTANT:  Gwenlyn Perking, M.D.  ANESTHESIA:  General.  INDICATIONS:  The patient is a 55 year old female who has been reporting having small amounts of stool per vagina after bowel movements.  She states her bowel movements are soft and formed.  She has undergone colonoscopy, and that demonstrated some polyps.  A barium enema has not demonstrated a connection.  She was seen by Dr. Edyth Gunnels, and there was an area of suspicion on his examination, but nothing could be demonstrated.  Methylene blue was injected to the anus, and she held it and the wore a pad in her underwear, but no methylene blue was noted.  However, she continues to have what she says is foul-smelling intermittent spotting of her underwear that smells like stool.  When I examined her, I felt an area that potentially could be a small connection.  She presents now for operative management.  TECHNIQUE:  She was placed supine in the bed, and general anesthetic was administered.  She was then placed in the prone jack knife position on the operating table.   The buttocks were retracted laterally.  The perineum and perianal areas were sterilely prepped and draped.  Initially, I performed  an examination of the vagina and felt some areas that appeared to be firm and indurated.  These were small vaginal rugae.  When I put gentle pressure on the posterior vaginal wall, I noticed some bubbles coming out of the anus.  I then inspected the anal area and saw an area that was very suspicious for connection near one of the anal polyps.  This was near the distal polyp.  I performed distal and proximal polypectomy and sent the specimens off for diagnosis.  I could not demonstrate a definite fistulous track and probed it very gently.  Because of this, I decided to go ahead and close this area of suspicion in layers.  I began from the rectum and closed it with partial thickness using mucosa and muscle in closing, oversewing and imbricating it with interrupted 2-0 Vicryl sutures.  I then changed my gloves and then, from the vaginal aspect of the posterior vaginal wall, I imbricated the area of suspicion by approximating mucosa to mucosa with interrupted 2-0 Vicryl sutures.  Then I directed my attention to the right posterior lateral hemorrhoid.  I ligated its vascular pedicle with 2-0 Vicryl suture, then excised it sharply, and reapproximated the anal dermis with running locking 2-0 Vicryl suture. I subsequently performed an anal block with 0.25 plain Marcaine.  At this point, hemostasis was adequate.  I placed a dry pad over the wounds. She subsequently was turned supine onto the stretcher, extubated, and taken to the recovery room in satisfactory condition.  We  will keep her overnight for observation as I think urinary retention is a potentially significant problem. DD:  12/24/99 TD:  12/24/99 Job: [id number]

## 2010-08-07 NOTE — Procedures (Signed)
[facility]  Patient:    [name]                       MRN: [id number] Proc. Date: 11/02/00 Attending:  Sabino Gasser, M.D.                           Procedure Report  PROCEDURE PERFORMED:  Upper endoscopy.  ENDOSCOPIST:  Sabino Gasser, M.D.  INDICATIONS:  Abdominal and chest pain.  ANESTHESIA:  Demerol 50 mg and Versed 6 mg.  DESCRIPTION OF PROCEDURE:  With the patient mildly sedated in the left lateral decubitus position, the Olympus videoscopic endoscope was inserted into the mouth and passed under direct vision through the esophagus, which appeared normal into the stomach, which appeared normal the fundus, body, antrum, duodenal bulb, and second portion of the duodenum.  The only question was whether the distal stomach and duodenum were somewhat extrinsically narrowed. From this point, the endoscope was slowly withdrawn taking circumferential views of the entire duodenal mucosa until the endoscope had been pulled back and the stomach placed in retroflexion view.  The stomach from below appeared normal.  The endoscope was then straightened and withdrawn taking circumferential views of the entire gastric and subsequently esophageal mucosa, which otherwise appeared normal.  The patients vital signs and pulse oximeter remained stable.  The patient tolerated the procedure well without apparent complications.  FINDINGS:  Normal endoscopy.  PLAN:  Computed tomography scan and possibly esophageal manometry for further evaluation. DD:  11/02/00 TD:  11/02/00 Job: [id number]

## 2010-08-10 ENCOUNTER — Other Ambulatory Visit: Payer: Self-pay | Admitting: Internal Medicine

## 2010-08-10 DIAGNOSIS — E041 Nontoxic single thyroid nodule: Secondary | ICD-10-CM

## 2010-08-11 ENCOUNTER — Ambulatory Visit
Admission: RE | Admit: 2010-08-11 | Discharge: 2010-08-11 | Disposition: A | Discharge status: Home / Self Care | Payer: 59 | Source: Ambulatory Visit | Attending: Internal Medicine | Admitting: Internal Medicine

## 2010-08-11 DIAGNOSIS — E041 Nontoxic single thyroid nodule: Secondary | ICD-10-CM

## 2010-08-12 ENCOUNTER — Other Ambulatory Visit: Payer: Self-pay | Admitting: Endocrinology

## 2010-08-12 DIAGNOSIS — E041 Nontoxic single thyroid nodule: Secondary | ICD-10-CM

## 2010-08-19 ENCOUNTER — Other Ambulatory Visit: Payer: Self-pay | Admitting: Interventional Radiology

## 2010-08-19 ENCOUNTER — Other Ambulatory Visit (HOSPITAL_COMMUNITY)
Admission: RE | Admit: 2010-08-19 | Discharge: 2010-08-19 | Disposition: A | Discharge status: Home / Self Care | Payer: 59 | Source: Ambulatory Visit | Attending: Interventional Radiology | Admitting: Interventional Radiology

## 2010-08-19 ENCOUNTER — Ambulatory Visit
Admission: RE | Admit: 2010-08-19 | Discharge: 2010-08-19 | Disposition: A | Discharge status: Home / Self Care | Payer: 59 | Source: Ambulatory Visit | Attending: Endocrinology | Admitting: Endocrinology

## 2010-08-19 DIAGNOSIS — E041 Nontoxic single thyroid nodule: Secondary | ICD-10-CM

## 2010-08-19 DIAGNOSIS — E049 Nontoxic goiter, unspecified: Secondary | ICD-10-CM | POA: Insufficient documentation

## 2010-09-02 ENCOUNTER — Encounter (INDEPENDENT_AMBULATORY_CARE_PROVIDER_SITE_OTHER): Payer: 59

## 2010-09-02 DIAGNOSIS — M899 Disorder of bone, unspecified: Secondary | ICD-10-CM

## 2010-09-16 ENCOUNTER — Other Ambulatory Visit: Payer: Self-pay | Admitting: Obstetrics and Gynecology

## 2010-09-16 ENCOUNTER — Encounter (INDEPENDENT_AMBULATORY_CARE_PROVIDER_SITE_OTHER): Payer: 59 | Admitting: Obstetrics and Gynecology

## 2010-09-16 ENCOUNTER — Other Ambulatory Visit (HOSPITAL_COMMUNITY)
Admission: RE | Admit: 2010-09-16 | Discharge: 2010-09-16 | Disposition: A | Discharge status: Home / Self Care | Payer: 59 | Source: Ambulatory Visit | Attending: Obstetrics and Gynecology | Admitting: Obstetrics and Gynecology

## 2010-09-16 DIAGNOSIS — E559 Vitamin D deficiency, unspecified: Secondary | ICD-10-CM

## 2010-09-16 DIAGNOSIS — Z124 Encounter for screening for malignant neoplasm of cervix: Secondary | ICD-10-CM | POA: Insufficient documentation

## 2010-09-16 DIAGNOSIS — Z01419 Encounter for gynecological examination (general) (routine) without abnormal findings: Secondary | ICD-10-CM

## 2011-02-20 HISTORY — PX: CARDIOVASCULAR STRESS TEST: SHX262

## 2011-03-10 ENCOUNTER — Other Ambulatory Visit: Payer: Self-pay | Admitting: Internal Medicine

## 2011-03-10 ENCOUNTER — Ambulatory Visit
Admission: RE | Admit: 2011-03-10 | Discharge: 2011-03-10 | Disposition: A | Discharge status: Home / Self Care | Payer: 59 | Source: Ambulatory Visit | Attending: Internal Medicine | Admitting: Internal Medicine

## 2011-03-10 DIAGNOSIS — M79669 Pain in unspecified lower leg: Secondary | ICD-10-CM

## 2011-06-03 ENCOUNTER — Ambulatory Visit (INDEPENDENT_AMBULATORY_CARE_PROVIDER_SITE_OTHER): Payer: 59 | Admitting: Obstetrics and Gynecology

## 2011-06-03 DIAGNOSIS — N760 Acute vaginitis: Secondary | ICD-10-CM

## 2011-06-03 DIAGNOSIS — N899 Noninflammatory disorder of vagina, unspecified: Secondary | ICD-10-CM

## 2011-06-03 DIAGNOSIS — N898 Other specified noninflammatory disorders of vagina: Secondary | ICD-10-CM

## 2011-06-03 DIAGNOSIS — B9689 Other specified bacterial agents as the cause of diseases classified elsewhere: Secondary | ICD-10-CM

## 2011-06-03 DIAGNOSIS — A499 Bacterial infection, unspecified: Secondary | ICD-10-CM

## 2011-06-03 LAB — WET PREP FOR TRICH, YEAST, CLUE
Clue Cells Wet Prep HPF POC: NONE SEEN
Trich, Wet Prep: NONE SEEN
Yeast Wet Prep HPF POC: NONE SEEN

## 2011-06-03 MED ORDER — METRONIDAZOLE 0.75 % VA GEL: 1.0000 | Freq: Every day | VAGINAL | Status: AC

## 2011-06-03 NOTE — Progress Notes (Signed)
Patient came in today for problem visit. She had been doing well until she had intercourse on Saturday. She felt like she was well lubricated but on initial penetration she had extreme vaginal pain with a little blood and she stopped immediately. She has felt irritated since then. She used Monistat for 3 days without help. She been on Augmentin for 10 days for sinusitis.  Exam: Kennon Portela present. External: Within normal limits. BUS: Within normal limits. Vaginal exam: Heavy white discharge. Moderate estrogen effect. Wet prep negative except for too numerous to count bacteria. Cervix and uterus absent. Bimanual negative.  Assessment: Bacterial vaginosis  Plan: MetroGel vaginal cream for 5 days.

## 2011-06-07 ENCOUNTER — Telehealth: Payer: Self-pay | Admitting: *Deleted

## 2011-06-07 NOTE — Telephone Encounter (Signed)
Left the below on pt vm. 

## 2011-06-07 NOTE — Telephone Encounter (Signed)
(  pt aware you are out of the office) Pt was given Metrogel vaginal gel 0.75% on 06/03/10, pt said that gel has made it worse no relief at all. Pt said she is experiencing pain with Metrogel. Please advise

## 2011-06-07 NOTE — Telephone Encounter (Signed)
Please give patient terconazole 3 cream.

## 2011-06-08 NOTE — Telephone Encounter (Signed)
PT. CALLED BACK. RX NOT AT HER PHARMACY. I CALLED RX TO PAT AT [facility] PT NOTIFIED RX WAS CALLED IN.

## 2011-06-11 ENCOUNTER — Telehealth: Payer: Self-pay

## 2011-06-11 MED ORDER — TERCONAZOLE 0.4 % VA CREA: 1.0000 | TOPICAL_CREAM | Freq: Every day | VAGINAL | Status: AC

## 2011-06-11 NOTE — Telephone Encounter (Signed)
PT NOTIFIED RX SENT TO PHARMACY

## 2011-06-11 NOTE — Telephone Encounter (Signed)
PT. STATES METRO GEL NOT HELPING HER SYMPTOMS AT ALL. STATES PAIN IS SEVERE ENOUGH IF SHE HAD A VICODIN ON HAND SHE WOULD TAKE IT. STATES SAME SYPTOMS ABOUT 2 YEARS AGO. PT. REQUESTING TERAZOL 7 TO TRY. IT HELPED LAST TIME ALONG WITH OTHER RX'S. SEE MULTIPLE OV'S OF 2010 BY YOU AND TF.

## 2011-06-11 NOTE — Telephone Encounter (Signed)
Okay to call her in Terazol 7 cream.

## 2011-06-17 ENCOUNTER — Telehealth: Payer: Self-pay | Admitting: *Deleted

## 2011-06-17 MED ORDER — KETOCONAZOLE 200 MG PO TABS: 200.0000 mg | ORAL_TABLET | Freq: Two times a day (BID) | ORAL | Status: AC

## 2011-06-17 MED ORDER — NYSTATIN-TRIAMCINOLONE 100000-0.1 UNIT/GM-% EX CREA: TOPICAL_CREAM | Freq: Two times a day (BID) | CUTANEOUS | Status: DC

## 2011-06-17 NOTE — Telephone Encounter (Signed)
rx sent to pharmacy

## 2011-06-17 NOTE — Telephone Encounter (Signed)
I have spoken with the patient. Call her in Nizoral 200 mg by mouth twice a day for 7 days and Mytrex cream use externally twice a day for 7 days. She uses [facility].

## 2011-06-17 NOTE — Telephone Encounter (Signed)
Pt calling c/o persistent yeast pt given Metrogel on OV 06/03/11, called on 06/07/11 given terconazole 3 cream, then on 06/11/11 Terazol 7 cream but said she is still having vaginal irritation, raw feeling, some discharge. Pt said medication never really took yeast away. Pt is calling to follow up. Please advise

## 2011-06-24 ENCOUNTER — Ambulatory Visit (INDEPENDENT_AMBULATORY_CARE_PROVIDER_SITE_OTHER): Payer: 59 | Admitting: Obstetrics and Gynecology

## 2011-06-24 DIAGNOSIS — B3731 Acute candidiasis of vulva and vagina: Secondary | ICD-10-CM

## 2011-06-24 DIAGNOSIS — N899 Noninflammatory disorder of vagina, unspecified: Secondary | ICD-10-CM

## 2011-06-24 DIAGNOSIS — N898 Other specified noninflammatory disorders of vagina: Secondary | ICD-10-CM

## 2011-06-24 DIAGNOSIS — B373 Candidiasis of vulva and vagina: Secondary | ICD-10-CM

## 2011-06-24 LAB — WET PREP FOR TRICH, YEAST, CLUE
Clue Cells Wet Prep HPF POC: NONE SEEN
Trich, Wet Prep: NONE SEEN
Yeast Wet Prep HPF POC: NONE SEEN

## 2011-06-24 MED ORDER — KETOCONAZOLE 200 MG PO TABS: 200.0000 mg | ORAL_TABLET | Freq: Two times a day (BID) | ORAL | Status: DC

## 2011-06-24 NOTE — Progress Notes (Signed)
Patient came back today because of persistence of vaginal symptoms. Her primary symptom is vulvar burning. We last treated  her with Nizoral N. Mytrex cream. She said she seen an improvement from a score of 10 down to 3. She has been on oral refresh for 2 years. She takes antibiotics several times a year for sinusitis which precipitates these infections.  Exam: Florian Buff present. External: Minimal vulvitis. BUS: Within normal limits. Vaginal exam: Some discharge with negative wet prep.  Assessment: Persistent yeast vaginitis  Plan: Repeat Nizoral 200 mg twice a day for 7 days. Stop oral refresh. Boric acid capsules 600 mg in vagina nightly for 7 days and then 2-3 times a week. Continue Mytrex cream.

## 2011-06-25 ENCOUNTER — Other Ambulatory Visit: Payer: Self-pay | Admitting: Obstetrics and Gynecology

## 2011-06-25 DIAGNOSIS — Z1231 Encounter for screening mammogram for malignant neoplasm of breast: Secondary | ICD-10-CM

## 2011-07-09 ENCOUNTER — Ambulatory Visit
Admission: RE | Admit: 2011-07-09 | Discharge: 2011-07-09 | Disposition: A | Discharge status: Home / Self Care | Payer: 59 | Source: Ambulatory Visit | Attending: Obstetrics and Gynecology | Admitting: Obstetrics and Gynecology

## 2011-07-09 DIAGNOSIS — Z1231 Encounter for screening mammogram for malignant neoplasm of breast: Secondary | ICD-10-CM

## 2011-07-12 ENCOUNTER — Telehealth: Payer: Self-pay | Admitting: *Deleted

## 2011-07-12 MED ORDER — FLUCONAZOLE 100 MG PO TABS: ORAL_TABLET | ORAL | Status: DC

## 2011-07-12 NOTE — Telephone Encounter (Signed)
Telephone call, states vaginal burning was rated at 12, now 5,had gotten better but the burning sensation has returned. States feels she needs another treatment. Has not tried Diflucan, will try Diflucan 100 by mouth twice a day for 3 days, then weekly for 1 week, then every other day for 2 weeks. Instructed to call if symptoms persist.  Victorino Dike, please call in Diflucan 100 as above #20 no refills.

## 2011-07-12 NOTE — Telephone Encounter (Signed)
rx sent to pharmacy

## 2011-07-12 NOTE — Telephone Encounter (Signed)
(  Pt of Dr.G) pt calling c/o yeast irritation and burning x 1 week now. Pt was given Nizoral 200 mg twice a day for 7 days & Boric acid capsules 600 mg in vagina nightly for 7 days and then 2-3 times a week. Pt said it was working until recently now burning and irritation back. Please advise

## 2011-07-16 ENCOUNTER — Telehealth: Payer: Self-pay | Admitting: *Deleted

## 2011-07-16 MED ORDER — KETOCONAZOLE 200 MG PO TABS: 200.0000 mg | ORAL_TABLET | Freq: Two times a day (BID) | ORAL | Status: DC

## 2011-07-16 NOTE — Telephone Encounter (Signed)
Addended by: Aura Camps on: 07/16/2011 11:25 AM   Modules accepted: Orders

## 2011-07-16 NOTE — Telephone Encounter (Signed)
When did she  finish Nizoral?

## 2011-07-16 NOTE — Telephone Encounter (Signed)
rx sent to pharmacy, and she will start back taking her boric acid 3 times a week, pt said she stopped taking them while on diflucan.

## 2011-07-16 NOTE — Telephone Encounter (Signed)
Repeat Nizoral for one week. 200 mg twice a day. Is  she still taking her boric acid?

## 2011-07-16 NOTE — Telephone Encounter (Signed)
Pt finished Nizoral on 4/11.

## 2011-07-16 NOTE — Telephone Encounter (Signed)
Follow up from previous telephone encounter 07/12/11, pt on day 5 of diflucan and still having burning, pain, white discharge this morning and last night only. Pt said that the Nizoral 200 mg worked while taking medication, but symptoms did return once completing medication. Please advise

## 2011-07-19 ENCOUNTER — Ambulatory Visit (INDEPENDENT_AMBULATORY_CARE_PROVIDER_SITE_OTHER): Payer: 59 | Admitting: Obstetrics and Gynecology

## 2011-07-19 DIAGNOSIS — R102 Pelvic and perineal pain: Secondary | ICD-10-CM

## 2011-07-19 DIAGNOSIS — N898 Other specified noninflammatory disorders of vagina: Secondary | ICD-10-CM

## 2011-07-19 DIAGNOSIS — N949 Unspecified condition associated with female genital organs and menstrual cycle: Secondary | ICD-10-CM

## 2011-07-19 DIAGNOSIS — N899 Noninflammatory disorder of vagina, unspecified: Secondary | ICD-10-CM

## 2011-07-19 LAB — WET PREP FOR TRICH, YEAST, CLUE
Clue Cells Wet Prep HPF POC: NONE SEEN
Trich, Wet Prep: NONE SEEN
Yeast Wet Prep HPF POC: NONE SEEN

## 2011-07-19 MED ORDER — CLOBETASOL PROPIONATE 0.05 % EX CREA: TOPICAL_CREAM | Freq: Two times a day (BID) | CUTANEOUS | Status: DC

## 2011-07-19 NOTE — Progress Notes (Signed)
Patient came back today with persistence of chronic vulvar burning and intermittent right lower quadrant pain that she characterizes as burning. She has had a hysterectomy. She is had her right ovary and tube removed along with lysis of adhesions and appendectomy. The last time we address this was over the phone last week. We have previously used Nizoral for a yeast vaginal infection. She had been pain free for 2 weeks but it reoccurred and we we treated her.  this time she said it did not help at all. During this last several months we have treated her with MetroGel, terconazole, Diflucan, Mytrex cream, Nizoral and boric acid.  Exam: Kennon Portela present. Abdomen is soft without guarding rebound or masses. External: No vulvitis. No pinpoint tenderness with a cotton-tipped applicator. BUS within normal limits. Vaginal exam no discharge and wet prep negative.  Assessment: Right lower quadrant pain. Vulvodynia.  Plan: Temovate cream. Pelvic ultrasound. Patient to look for contact irritants. Consider referral to Cherrie Gauze.

## 2011-07-21 ENCOUNTER — Ambulatory Visit (INDEPENDENT_AMBULATORY_CARE_PROVIDER_SITE_OTHER): Payer: 59 | Admitting: Obstetrics and Gynecology

## 2011-07-21 ENCOUNTER — Ambulatory Visit (INDEPENDENT_AMBULATORY_CARE_PROVIDER_SITE_OTHER): Payer: 59

## 2011-07-21 DIAGNOSIS — N949 Unspecified condition associated with female genital organs and menstrual cycle: Secondary | ICD-10-CM

## 2011-07-21 DIAGNOSIS — R102 Pelvic and perineal pain: Secondary | ICD-10-CM

## 2011-07-21 NOTE — Progress Notes (Signed)
Patient came back to see me today because of right lower quadrant pain and  Vulvodynia. On ultrasound her uterus is surgically absent. Her right ovary and tube were also surgically absent and we could see no mass today in the right lower quadrant. Her left adnexa shows a normal ovary without any disease. Her cul-de-sac is free of fluid. The new cream we have given her has not helped.  Assessment: Right lower quadrant pain.: Vulvadynia.  Plan: So far we have not been able to resolve her problem. See  Old notes. We are going to refer her to the Dr. Jonny Ruiz stege's group.

## 2011-07-22 ENCOUNTER — Telehealth: Payer: Self-pay | Admitting: *Deleted

## 2011-07-22 MED ORDER — IBUPROFEN 800 MG PO TABS: 800.0000 mg | ORAL_TABLET | Freq: Four times a day (QID) | ORAL | Status: AC | PRN

## 2011-07-22 NOTE — Telephone Encounter (Signed)
Pt was seen yesterday for pelvic pain and will be referred to Dr. Fredrich Birks in [place], but would like some pain medication to help with the vaginal pain if possible until appointment is set. Please advise

## 2011-07-22 NOTE — Telephone Encounter (Signed)
Pt informed with the below note, rx sent. 

## 2011-07-22 NOTE — Telephone Encounter (Signed)
Ibuprofen 800 mg tablets. One every 6 hours. Numbers 30.

## 2011-07-28 ENCOUNTER — Telehealth: Payer: Self-pay | Admitting: *Deleted

## 2011-07-28 NOTE — Telephone Encounter (Signed)
Message copied by Libby Maw on Wed Jul 28, 2011 11:53 AM ------      Message from: Trellis Paganini      Created: Wed Jul 21, 2011 12:50 PM       Please refer pt to Dr. Fontaine No group in [place]. She will see first person in group who has opening. Send them all office encounters and telephone encounters starting with March 14 of this year.

## 2011-07-28 NOTE — Telephone Encounter (Signed)
Appt set up with Dr. Barnetta Hammersmith on 08/23/11 @ 9:30.  Records were sent and they faxed appt information.

## 2011-08-23 DIAGNOSIS — N9489 Other specified conditions associated with female genital organs and menstrual cycle: Secondary | ICD-10-CM | POA: Insufficient documentation

## 2011-08-31 ENCOUNTER — Ambulatory Visit: Payer: 59 | Attending: Obstetrics and Gynecology | Admitting: Physical Therapy

## 2011-08-31 DIAGNOSIS — M629 Disorder of muscle, unspecified: Secondary | ICD-10-CM | POA: Insufficient documentation

## 2011-08-31 DIAGNOSIS — M25659 Stiffness of unspecified hip, not elsewhere classified: Secondary | ICD-10-CM | POA: Insufficient documentation

## 2011-08-31 DIAGNOSIS — IMO0001 Reserved for inherently not codable concepts without codable children: Secondary | ICD-10-CM | POA: Insufficient documentation

## 2011-08-31 DIAGNOSIS — M2420 Disorder of ligament, unspecified site: Secondary | ICD-10-CM | POA: Insufficient documentation

## 2011-09-10 ENCOUNTER — Ambulatory Visit: Payer: 59 | Admitting: Physical Therapy

## 2011-09-16 ENCOUNTER — Ambulatory Visit: Payer: 59 | Admitting: Physical Therapy

## 2011-09-21 ENCOUNTER — Encounter: Payer: Self-pay | Admitting: Physical Therapy

## 2012-02-07 ENCOUNTER — Encounter: Payer: Self-pay | Admitting: Obstetrics and Gynecology

## 2012-04-20 ENCOUNTER — Other Ambulatory Visit: Payer: Self-pay | Admitting: Registered Nurse

## 2012-04-20 ENCOUNTER — Other Ambulatory Visit (HOSPITAL_COMMUNITY)
Admission: RE | Admit: 2012-04-20 | Discharge: 2012-04-20 | Disposition: A | Discharge status: Home / Self Care | Payer: 59 | Source: Ambulatory Visit | Attending: Internal Medicine | Admitting: Internal Medicine

## 2012-04-20 DIAGNOSIS — Z01419 Encounter for gynecological examination (general) (routine) without abnormal findings: Secondary | ICD-10-CM | POA: Insufficient documentation

## 2012-05-11 ENCOUNTER — Encounter (INDEPENDENT_AMBULATORY_CARE_PROVIDER_SITE_OTHER): Payer: Self-pay | Admitting: General Surgery

## 2012-05-11 ENCOUNTER — Ambulatory Visit (INDEPENDENT_AMBULATORY_CARE_PROVIDER_SITE_OTHER): Payer: 59 | Admitting: General Surgery

## 2012-05-11 VITALS — BP 110/60 | HR 92 | Temp 97.9°F | Resp 18 | Ht 63.0 in | Wt 107.2 lb

## 2012-05-11 DIAGNOSIS — K645 Perianal venous thrombosis: Secondary | ICD-10-CM | POA: Insufficient documentation

## 2012-05-11 MED ORDER — HYDROCORTISONE 2.5 % RE CREA: TOPICAL_CREAM | Freq: Four times a day (QID) | RECTAL | Status: DC

## 2012-05-11 NOTE — Progress Notes (Signed)
Patient ID: [name], female   DOB: 1955-06-12, 57 y.o.   MRN: [id number]  No chief complaint on file.   HPI [name] is a 57 y.o. female.   HPI  She is self-referred. She had a gastrointestinal illness that included a lot of diarrhea in January. Sometime after that she began noticing some swelling in the anal area that progressively increased. She was seen by her regular physician and was given some suppositories and Proctofoam. However, she is not noticed any substantial change. She no longer has diarrhea. She does not have constipation. No bleeding. She's not had anything like this before.  Past Medical History  Diagnosis Date  . Prolapsed uterus   . RLQ abdominal pain     OVARIAN CYST  . Colon polyps   . Vitamin D deficiency   . Low bone mass     Past Surgical History  Procedure Laterality Date  . Appendectomy  05/02/2008    BY LAPAROSCOPY  . Pelvic laparoscopy  05/02/2008    RIGHT SALPINGO-OOPHORECTOMY AND EXCISION  OF CYSTIC RLQ ADHESIONS  . Abdominal hysterectomy  1989    VAGINAL   . Rectovaginal fistula closure  2001    Family History  Problem Relation Age of Onset  . Hypertension Mother   . Cancer Mother     KIDNEY  . Heart disease Mother     Social History History  Substance Use Topics  . Smoking status: Unknown If Ever Smoked  . Smokeless tobacco: Not on file  . Alcohol Use: No    Allergies  Allergen Reactions  . Ciprofloxacin     Current Outpatient Prescriptions  Medication Sig Dispense Refill  . cetirizine (ZYRTEC) 10 MG tablet Take 10 mg by mouth daily.      . Cholecalciferol (VITAMIN D PO) Take by mouth daily.      Marland Kitchen estradiol (CLIMARA - DOSED IN MG/24 HR) 0.075 mg/24hr Place 1 patch onto the skin 2 (two) times a week.      . nitrofurantoin (MACRODANTIN) 100 MG capsule Take 100 mg by mouth daily.        . nortriptyline (PAMELOR) 25 MG capsule Take 25 mg by mouth at bedtime.      . hydrocortisone (ANUSOL-HC) 2.5 % rectal cream Place  rectally 4 (four) times daily.  30 g  0  . ketoconazole (NIZORAL) 200 MG tablet Take 1 tablet (200 mg total) by mouth 2 (two) times daily.  14 tablet  0   No current facility-administered medications for this visit.    Review of Systems Review of Systems  Constitutional: Negative.   Gastrointestinal: Positive for rectal pain. Negative for abdominal pain, diarrhea and constipation.    Blood pressure 110/60, pulse 92, temperature 97.9 F (36.6 C), temperature source Temporal, resp. rate 18, height 5\' 3"  (1.6 m), weight 107 lb 3.2 oz (48.626 kg).  Physical Exam Physical Exam  Constitutional: She appears well-developed and well-nourished. No distress.  Genitourinary:  Soft swelling in left lateral area consistent with resolving thrombosed external hemorrhoid. No fissure. No anal masses on digital rectal exam.    Data Reviewed None  Assessment    External thrombosed hemorrhoid that is slowly resolving.     Plan    Witch hazel to the area 4 times a day. Anusol HC to the area 4 times a day. Warm water soaks twice a day. If she is not better in 2-3 weeks I told to call for an appointment and we can discuss hemorrhoidectomy.  Dejohn Ibarra J 05/11/2012, 2:49 PM

## 2012-05-11 NOTE — Patient Instructions (Signed)
Avoid constipation. Do not sit on hard surfaces. Avoid prolonged sitting.  Apply witch hazel to the area 4 times a day. Due warm water tub soaks for 10 minutes twice a day. Apply the Analpram to the area 4 times a day. If it is not better in 2-3 weeks please call and make an appointment to come back and talk about hemorrhoidectomy.

## 2012-05-25 ENCOUNTER — Other Ambulatory Visit: Payer: Self-pay | Admitting: Registered Nurse

## 2012-06-23 ENCOUNTER — Other Ambulatory Visit: Payer: Self-pay | Admitting: Surgery

## 2012-07-19 ENCOUNTER — Other Ambulatory Visit: Payer: Self-pay

## 2012-07-19 DIAGNOSIS — Z1231 Encounter for screening mammogram for malignant neoplasm of breast: Secondary | ICD-10-CM

## 2012-08-04 ENCOUNTER — Ambulatory Visit: Payer: Self-pay

## 2012-08-11 ENCOUNTER — Ambulatory Visit
Admission: RE | Admit: 2012-08-11 | Discharge: 2012-08-11 | Disposition: A | Discharge status: Home / Self Care | Payer: 59 | Source: Ambulatory Visit

## 2012-08-11 DIAGNOSIS — Z1231 Encounter for screening mammogram for malignant neoplasm of breast: Secondary | ICD-10-CM

## 2012-12-10 DIAGNOSIS — N9089 Other specified noninflammatory disorders of vulva and perineum: Secondary | ICD-10-CM | POA: Insufficient documentation

## 2012-12-10 DIAGNOSIS — B3731 Acute candidiasis of vulva and vagina: Secondary | ICD-10-CM | POA: Insufficient documentation

## 2013-02-02 ENCOUNTER — Other Ambulatory Visit: Payer: Self-pay | Admitting: Gastroenterology

## 2013-02-02 DIAGNOSIS — R109 Unspecified abdominal pain: Secondary | ICD-10-CM

## 2013-02-06 ENCOUNTER — Ambulatory Visit
Admission: RE | Admit: 2013-02-06 | Discharge: 2013-02-06 | Disposition: A | Discharge status: Home / Self Care | Payer: 59 | Source: Ambulatory Visit | Attending: Gastroenterology | Admitting: Gastroenterology

## 2013-02-06 DIAGNOSIS — R109 Unspecified abdominal pain: Secondary | ICD-10-CM

## 2013-02-06 MED ORDER — IOHEXOL 300 MG/ML  SOLN
100.0000 mL | Freq: Once | INTRAMUSCULAR | Status: AC | PRN
  Administered 2013-02-06: 100 mL via INTRAVENOUS

## 2013-02-27 ENCOUNTER — Encounter: Payer: Self-pay | Admitting: Cardiology

## 2013-02-27 ENCOUNTER — Ambulatory Visit (INDEPENDENT_AMBULATORY_CARE_PROVIDER_SITE_OTHER): Payer: BC Managed Care – PPO | Admitting: Cardiology

## 2013-02-27 VITALS — BP 130/70 | HR 60 | Ht 63.0 in | Wt 106.5 lb

## 2013-02-27 DIAGNOSIS — R002 Palpitations: Secondary | ICD-10-CM

## 2013-02-27 DIAGNOSIS — R9389 Abnormal findings on diagnostic imaging of other specified body structures: Secondary | ICD-10-CM

## 2013-02-27 NOTE — Progress Notes (Signed)
PATIENT: [name] MRN: [id number]  DOB: [birth year]   DOV:03/01/2013 PCP: Londell Moh, MD  Clinic Note: Chief Complaint  Patient presents with  . Follow-up    CT scan-found small pericardial effusion C/o occas swelling in ankles and occas dizziness- lasts for "a feww hours."   HPI: [name] is a 57 y.o.  female with a PMH below who presents today for almost an 2 year visit, that is somewhat early after a she had a CT scan showing possible pericardial effusion. I last saw her in January of 2013 4 some chest discomfort episodes with hiccuping and a bubbly sensation in her chest. I chose not to do any formal evaluation of her symptoms because she is extremely active with various activities and exercises. The inclination was that her symptoms are mostly GI in nature. I simply referred back to her private physician. Chest he did have a stress test in December 2012 where she went 11 METs, had normal EF incision negative Myoview. Does not been scanned into the system yet.  Interval History:  he comes in today feeling fine for cardiac standpoint. She had some significant GI symptoms with diarrhea and nausea. As part of this evaluation she ended up having CT scan of the abdomen and pelvis in November that suggested a small pericardial effusion. She has not had any symptoms of chest tightness or pressure with rest or exertion no positional chest discomfort. No significant dyspnea with rest or exertion. She does occasional lightheadedness that lasts a few minutes to hour, but nothing as she is overly worried about.  She does occasionally note some palpitations but nothing prolonged with concern for tachyarrhythmia. He profoundly denies any heart failure class ischemic symptoms such as exertional dyspnea or chest pain.  The remainder of Cardiovascular ROS: no chest pain or dyspnea on exertion positive for - chest pain, dyspnea on exertion, irregular heartbeat and palpitations negative for  - loss of consciousness, orthopnea, palpitations, paroxysmal nocturnal dyspnea or shortness of breath: Additional cardiac review of systems: Lightheadedness - occasional, dizziness - occasional, syncope/near-syncope - no; TIA/amaurosis fugax - no Melena - no, hematochezia no; hematuria - no; nosebleeds - no; claudication - no  Past Medical History  Diagnosis Date  . Prolapsed uterus   . RLQ abdominal pain     OVARIAN CYST  . Colon polyps   . Vitamin D deficiency   . Low bone mass     Prior Cardiac Evaluation and Past Surgical History: Past Surgical History  Procedure Laterality Date  . Appendectomy  05/02/2008    BY LAPAROSCOPY  . Pelvic laparoscopy  05/02/2008    RIGHT SALPINGO-OOPHORECTOMY AND EXCISION  OF CYSTIC RLQ ADHESIONS  . Abdominal hysterectomy  1989    VAGINAL   . Rectovaginal fistula closure  2001  . Cardiovascular stress test  December 2012    10 min, 11 METS; No ishcemia / no Infarction;, METS = 11Peak HR 180 bpm;; Ecellent Exercise Tolerance    Allergies  Allergen Reactions  . Ciprofloxacin     Current Outpatient Prescriptions  Medication Sig Dispense Refill  . Cholecalciferol 1000 UNITS capsule Take 1,000 Units by mouth daily.      Marland Kitchen estradiol (CLIMARA - DOSED IN MG/24 HR) 0.075 mg/24hr Place 1 patch onto the skin 2 (two) times a week.      . Flaxseed, Linseed, (FLAX SEEDS) POWD Take by mouth 3 (three) times a week.      . nitrofurantoin (MACRODANTIN) 100 MG capsule Take 100  mg by mouth daily.        . pregabalin (LYRICA) 75 MG capsule Take 75 mg by mouth daily.       No current facility-administered medications for this visit.    History   Social History Narrative   She is a married, mother of 3 ratio exercise routinely walking her dog. She does not smoke or drink. Quit smoking in 2000. 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 Visual  lightheadedness. she also has mild puffy swelling along the bilateral lateral malleoli.  Her GI upset with fever and loose stools. Intermittent leg swelling.  PHYSICAL EXAM BP 130/70  Pulse 60  Ht 5\' 3"  (1.6 m)  Wt 106 lb 8 oz (48.308 kg)  BMI 18.87 kg/m2 General, she is a very pleasant, healthy-appearing woman. She is somewhat thin in nature but not frail, well nourished. Normal mood and affect. Cranial nerves 2-12: Grossly intact.  HEENT: Normocephalic and atraumatic. Extraocular muscles are intact. Mucous membranes are moist. Neck: Supple without lymphadenopathy, thyromegaly, or JVD. No carotid bruit.  Heart:RRR, Normal S1, S2. No murmurs, rubs, or gallops. Nondisplaced PMI.  No friction rub Lungs: Clear to auscultation bilaterally. Normal respiratory effort. Good air movement. Nonlabored. Abdomen: Soft, NT/ND/NABS, No HSM   Extremities: No clubbing, cyanosis, or edema; 2+ equal pulses throughout. Neurologic: Cranial nerves 2-12 grossly intact. Normal gait. Normal strength throughout. She is alert and oriented x3.   ZOX:[id number] today: Yes Rate:60  , Rhythm: Sinus; low voltage but otherwise normal. ;  Recent Labs:  one quarter   ASSESSMENT / PLAN: Abnormal finding on CT scan There was concern about possible pericardial effusion noted on CT scan of her chest. I reviewed these images, and is really almost a cryptic commented that covers the bases that there is a lucent segment just posterior to the right ventricle along the inferior basal wall. Unfortunately when this small size of pericardial effusion even at present it would not be seen back in the on echocardiogram. He is not having any hip symptoms and note simple heart failure restrictive symptoms for now it's probably not prudent to proceed with a echocardiogram unless her some more untoward symptoms.  Palpitations Really no significant problems with palpitations. I she patient's her last visit, they're relatively well-controlled  now mostly do not bother her.   Orders Placed This Encounter  Procedures  . EKG 12-Lead   Meds ordered this encounter  Medications  . Cholecalciferol 1000 UNITS capsule    Sig: Take 1,000 Units by mouth daily.  . pregabalin (LYRICA) 75 MG capsule    Sig: Take 75 mg by mouth daily.  . Flaxseed, Linseed, (FLAX SEEDS) POWD    Sig: Take by mouth 3 (three) times a week.    Followup:  when necessary   DAVID W. Herbie Baltimore, M.D., M.S. [facility] [address] [place], [place]  [address]  [phone] Pager # [phone]

## 2013-02-27 NOTE — Patient Instructions (Signed)
Discussion with Dr Mechele Dawley CT SCAN showed a small possible effusion but it would not show up on an echo   May return on an a as needed  Basis.

## 2013-02-28 ENCOUNTER — Encounter: Payer: Self-pay | Admitting: Cardiology

## 2013-03-01 ENCOUNTER — Encounter: Payer: Self-pay | Admitting: Cardiology

## 2013-03-01 DIAGNOSIS — R002 Palpitations: Secondary | ICD-10-CM | POA: Insufficient documentation

## 2013-03-01 DIAGNOSIS — R9389 Abnormal findings on diagnostic imaging of other specified body structures: Secondary | ICD-10-CM | POA: Insufficient documentation

## 2013-03-01 NOTE — Assessment & Plan Note (Addendum)
There was concern about possible pericardial effusion noted on CT scan of her chest. I reviewed these images, and is really almost a cryptic commented that covers the bases that there is a lucent segment just posterior to the right ventricle along the inferior basal wall. Unfortunately when this small size of pericardial effusion even at present it would not be seen back in the on echocardiogram. He is not having any hip symptoms and note simple heart failure restrictive symptoms for now it's probably not prudent to proceed with a echocardiogram unless her some more untoward symptoms.

## 2013-03-01 NOTE — Assessment & Plan Note (Addendum)
Really no significant problems with palpitations. I she patient's her last visit, they're relatively well-controlled now mostly do not bother her.

## 2013-05-09 ENCOUNTER — Other Ambulatory Visit: Payer: Self-pay | Admitting: Internal Medicine

## 2013-05-09 ENCOUNTER — Other Ambulatory Visit: Payer: Self-pay | Admitting: Endocrinology

## 2013-05-09 DIAGNOSIS — E041 Nontoxic single thyroid nodule: Secondary | ICD-10-CM

## 2013-05-09 DIAGNOSIS — N6324 Unspecified lump in the left breast, lower inner quadrant: Secondary | ICD-10-CM

## 2013-05-11 ENCOUNTER — Ambulatory Visit
Admission: RE | Admit: 2013-05-11 | Discharge: 2013-05-11 | Disposition: A | Discharge status: Home / Self Care | Payer: BC Managed Care – PPO | Source: Ambulatory Visit | Attending: Internal Medicine | Admitting: Internal Medicine

## 2013-05-11 ENCOUNTER — Other Ambulatory Visit: Payer: 59

## 2013-05-11 DIAGNOSIS — E041 Nontoxic single thyroid nodule: Secondary | ICD-10-CM

## 2013-05-21 ENCOUNTER — Ambulatory Visit
Admission: RE | Admit: 2013-05-21 | Discharge: 2013-05-21 | Disposition: A | Discharge status: Home / Self Care | Payer: BC Managed Care – PPO | Source: Ambulatory Visit | Attending: Endocrinology | Admitting: Endocrinology

## 2013-05-21 DIAGNOSIS — N6324 Unspecified lump in the left breast, lower inner quadrant: Secondary | ICD-10-CM

## 2013-06-11 ENCOUNTER — Other Ambulatory Visit: Payer: Self-pay | Admitting: Gastroenterology

## 2013-06-11 DIAGNOSIS — R1013 Epigastric pain: Secondary | ICD-10-CM

## 2013-06-15 ENCOUNTER — Ambulatory Visit
Admission: RE | Admit: 2013-06-15 | Discharge: 2013-06-15 | Disposition: A | Discharge status: Home / Self Care | Payer: BC Managed Care – PPO | Source: Ambulatory Visit | Attending: Gastroenterology | Admitting: Gastroenterology

## 2013-06-15 DIAGNOSIS — R1013 Epigastric pain: Secondary | ICD-10-CM

## 2013-07-06 ENCOUNTER — Other Ambulatory Visit (HOSPITAL_COMMUNITY): Payer: Self-pay | Admitting: Gastroenterology

## 2013-07-06 DIAGNOSIS — R109 Unspecified abdominal pain: Secondary | ICD-10-CM

## 2013-07-10 ENCOUNTER — Other Ambulatory Visit: Payer: Self-pay

## 2013-07-10 DIAGNOSIS — Z1231 Encounter for screening mammogram for malignant neoplasm of breast: Secondary | ICD-10-CM

## 2013-07-16 ENCOUNTER — Ambulatory Visit (HOSPITAL_COMMUNITY)
Admission: RE | Admit: 2013-07-16 | Discharge: 2013-07-16 | Disposition: A | Discharge status: Home / Self Care | Payer: BC Managed Care – PPO | Source: Ambulatory Visit | Attending: Gastroenterology | Admitting: Gastroenterology

## 2013-07-16 DIAGNOSIS — R109 Unspecified abdominal pain: Secondary | ICD-10-CM | POA: Insufficient documentation

## 2013-07-16 MED ORDER — SINCALIDE 5 MCG IJ SOLR
0.0200 ug/kg | Freq: Once | INTRAMUSCULAR | Status: AC
  Administered 2013-07-16: 5 ug via INTRAVENOUS

## 2013-07-16 MED ORDER — TECHNETIUM TC 99M MEBROFENIN IV KIT
5.0000 | PACK | Freq: Once | INTRAVENOUS | Status: AC | PRN
  Administered 2013-07-16: 5 via INTRAVENOUS

## 2013-07-16 MED ORDER — SINCALIDE 5 MCG IJ SOLR
INTRAMUSCULAR | Status: AC
  Administered 2013-07-16: 5 ug via INTRAVENOUS
  Filled 2013-07-16: qty 5

## 2013-08-07 ENCOUNTER — Ambulatory Visit (INDEPENDENT_AMBULATORY_CARE_PROVIDER_SITE_OTHER): Payer: BC Managed Care – PPO | Admitting: General Surgery

## 2013-08-07 ENCOUNTER — Encounter (INDEPENDENT_AMBULATORY_CARE_PROVIDER_SITE_OTHER): Payer: Self-pay

## 2013-08-07 ENCOUNTER — Encounter (INDEPENDENT_AMBULATORY_CARE_PROVIDER_SITE_OTHER): Payer: Self-pay | Admitting: General Surgery

## 2013-08-07 VITALS — BP 118/80 | HR 73 | Temp 98.2°F | Ht 63.0 in | Wt 101.0 lb

## 2013-08-07 DIAGNOSIS — R1013 Epigastric pain: Secondary | ICD-10-CM | POA: Insufficient documentation

## 2013-08-07 NOTE — Patient Instructions (Signed)
Avoid red meat. Avoid excessively fatty foods. Avoid eating a lot of fried food.

## 2013-08-07 NOTE — Progress Notes (Signed)
Patient ID: [name], female   DOB: 10/28/1955, 58 y.o.   MRN: [id number]  Chief Complaint  Patient presents with  . eval gallbladder    HPI [name] is a 58 y.o. female.   HPI  She is referred by Dr. Dulce Sellarutlaw because of upper abdominal pain and to discuss possible cholecystectomy. In October 2014, she awoke one night with severe sharp pains across her upper abdomen. These persisted. She was placed on some antacids and pain medication and did get better. She started restricting her diet some but when she ate red meat she seemed to get the pain back again. She tells me she can tolerate chicken, pork, and fish without any pain. She even eats some fried seafood without difficulty. She tolerates peanut butter, cheese, and regular salad dressing. She is narrowed it down to red meat as being a producer of her symptoms. She underwent a thorough evaluation including a CT scan which was essentially unremarkable with respect for a cause of her pain, an abdominal ultrasound demonstrated a normal gallbladder, a hepatobiliary scan which demonstrated an abnormal gallbladder function, and an upper endoscopy which did not get a good explanation for her pain. She has lost some weight because of restricting her diet.  Past Medical History  Diagnosis Date  . Prolapsed uterus   . RLQ abdominal pain     OVARIAN CYST  . Colon polyps   . Vitamin D deficiency   . Low bone mass     Past Surgical History  Procedure Laterality Date  . Appendectomy  05/02/2008    BY LAPAROSCOPY  . Pelvic laparoscopy  05/02/2008    RIGHT SALPINGO-OOPHORECTOMY AND EXCISION  OF CYSTIC RLQ ADHESIONS  . Abdominal hysterectomy  1989    VAGINAL   . Rectovaginal fistula closure  2001  . Cardiovascular stress test  December 2012    10 min, 11 METS; No ishcemia / no Infarction;, METS = 11Peak HR 180 bpm;; Ecellent Exercise Tolerance    Family History  Problem Relation Age of Onset  . Hypertension Mother   . Cancer Mother      KIDNEY  . Heart disease Mother     Social History History  Substance Use Topics  . Smoking status: Never Smoker   . Smokeless tobacco: Never Used  . Alcohol Use: No    Allergies  Allergen Reactions  . Ciprofloxacin     Current Outpatient Prescriptions  Medication Sig Dispense Refill  . Cholecalciferol 1000 UNITS capsule Take 1,000 Units by mouth daily.      Marland Kitchen. estradiol (CLIMARA - DOSED IN MG/24 HR) 0.075 mg/24hr Place 1 patch onto the skin 2 (two) times a week.      . Flaxseed, Linseed, (FLAX SEEDS) POWD Take by mouth 3 (three) times a week.      . nitrofurantoin (MACRODANTIN) 100 MG capsule Take 100 mg by mouth daily.        . pregabalin (LYRICA) 75 MG capsule Take 75 mg by mouth daily.       No current facility-administered medications for this visit.    Review of Systems Review of Systems  Constitutional: Negative for fever and chills.  Respiratory: Negative.   Cardiovascular: Negative.   Gastrointestinal: Positive for nausea, abdominal pain and diarrhea.    Blood pressure 118/80, pulse 73, temperature 98.2 F (36.8 C), height 5\' 3"  (1.6 m), weight 101 lb (45.813 kg).  Physical Exam Physical Exam  Constitutional: No distress.  Thin female.  HENT:  Head:  Normocephalic and atraumatic.  Eyes: No scleral icterus.  Abdominal: Soft. She exhibits no distension and no mass. There is no tenderness.  Neurological: She is alert.  Skin: Skin is warm and dry.  Psychiatric: She has a normal mood and affect. Her behavior is normal.    Data Reviewed US, [place] Hepatobiliary scan, CT, Endoscopy, notes from Dr. Dulce Sellarutlaw.  Assessment    Upper abdominal pain. It seems to be related to being red meat. She is able to eat most everything else without pain. All gallbladder studies are normal.  I doubt this is gallbladder disease.     Plan    We discussed a restricted diet including avoiding red meat and a lot of fried or fatty foods. Return prn.       Jim Desanctisodd J  Kimyata Milich 08/07/2013, 3:35 PM

## 2013-08-17 ENCOUNTER — Encounter (INDEPENDENT_AMBULATORY_CARE_PROVIDER_SITE_OTHER): Payer: Self-pay

## 2013-08-24 ENCOUNTER — Ambulatory Visit
Admission: RE | Admit: 2013-08-24 | Discharge: 2013-08-24 | Disposition: A | Discharge status: Home / Self Care | Payer: BC Managed Care – PPO | Source: Ambulatory Visit

## 2013-08-24 DIAGNOSIS — Z1231 Encounter for screening mammogram for malignant neoplasm of breast: Secondary | ICD-10-CM

## 2014-08-08 LAB — HM COLONOSCOPY

## 2014-08-09 ENCOUNTER — Other Ambulatory Visit: Payer: Self-pay

## 2014-08-09 DIAGNOSIS — Z1231 Encounter for screening mammogram for malignant neoplasm of breast: Secondary | ICD-10-CM

## 2014-08-30 ENCOUNTER — Ambulatory Visit
Admission: RE | Admit: 2014-08-30 | Discharge: 2014-08-30 | Disposition: A | Discharge status: Home / Self Care | Payer: BLUE CROSS/BLUE SHIELD | Source: Ambulatory Visit

## 2014-08-30 ENCOUNTER — Encounter (INDEPENDENT_AMBULATORY_CARE_PROVIDER_SITE_OTHER): Payer: Self-pay

## 2014-08-30 DIAGNOSIS — Z1231 Encounter for screening mammogram for malignant neoplasm of breast: Secondary | ICD-10-CM

## 2015-07-16 DIAGNOSIS — E559 Vitamin D deficiency, unspecified: Secondary | ICD-10-CM | POA: Diagnosis not present

## 2015-07-16 DIAGNOSIS — Z0000 Encounter for general adult medical examination without abnormal findings: Secondary | ICD-10-CM | POA: Diagnosis not present

## 2015-07-18 ENCOUNTER — Other Ambulatory Visit: Payer: Self-pay | Admitting: Registered Nurse

## 2015-07-18 ENCOUNTER — Other Ambulatory Visit (HOSPITAL_COMMUNITY)
Admission: RE | Admit: 2015-07-18 | Discharge: 2015-07-18 | Disposition: A | Discharge status: Home / Self Care | Payer: BLUE CROSS/BLUE SHIELD | Source: Ambulatory Visit | Attending: Internal Medicine | Admitting: Internal Medicine

## 2015-07-18 DIAGNOSIS — Z01419 Encounter for gynecological examination (general) (routine) without abnormal findings: Secondary | ICD-10-CM | POA: Insufficient documentation

## 2015-07-18 DIAGNOSIS — Z0000 Encounter for general adult medical examination without abnormal findings: Secondary | ICD-10-CM | POA: Diagnosis not present

## 2015-07-18 DIAGNOSIS — E559 Vitamin D deficiency, unspecified: Secondary | ICD-10-CM | POA: Diagnosis not present

## 2015-07-21 DIAGNOSIS — Z780 Asymptomatic menopausal state: Secondary | ICD-10-CM | POA: Diagnosis not present

## 2015-07-23 LAB — CYTOLOGY - PAP

## 2015-07-29 DIAGNOSIS — R112 Nausea with vomiting, unspecified: Secondary | ICD-10-CM | POA: Diagnosis not present

## 2015-07-29 DIAGNOSIS — R509 Fever, unspecified: Secondary | ICD-10-CM | POA: Diagnosis not present

## 2015-07-29 DIAGNOSIS — K529 Noninfective gastroenteritis and colitis, unspecified: Secondary | ICD-10-CM | POA: Diagnosis not present

## 2015-07-29 DIAGNOSIS — R197 Diarrhea, unspecified: Secondary | ICD-10-CM | POA: Diagnosis not present

## 2015-07-31 ENCOUNTER — Other Ambulatory Visit: Payer: Self-pay

## 2015-07-31 DIAGNOSIS — Z1231 Encounter for screening mammogram for malignant neoplasm of breast: Secondary | ICD-10-CM

## 2015-08-04 DIAGNOSIS — R509 Fever, unspecified: Secondary | ICD-10-CM | POA: Diagnosis not present

## 2015-09-05 ENCOUNTER — Ambulatory Visit: Payer: BLUE CROSS/BLUE SHIELD

## 2015-09-12 ENCOUNTER — Ambulatory Visit
Admission: RE | Admit: 2015-09-12 | Discharge: 2015-09-12 | Disposition: A | Discharge status: Home / Self Care | Payer: BLUE CROSS/BLUE SHIELD | Source: Ambulatory Visit

## 2015-09-12 ENCOUNTER — Other Ambulatory Visit: Payer: Self-pay

## 2015-09-12 DIAGNOSIS — Z1231 Encounter for screening mammogram for malignant neoplasm of breast: Secondary | ICD-10-CM

## 2015-11-28 DIAGNOSIS — J3089 Other allergic rhinitis: Secondary | ICD-10-CM | POA: Diagnosis not present

## 2015-11-28 DIAGNOSIS — J3081 Allergic rhinitis due to animal (cat) (dog) hair and dander: Secondary | ICD-10-CM | POA: Diagnosis not present

## 2016-01-02 DIAGNOSIS — Z23 Encounter for immunization: Secondary | ICD-10-CM | POA: Diagnosis not present

## 2016-04-05 DIAGNOSIS — S4992XA Unspecified injury of left shoulder and upper arm, initial encounter: Secondary | ICD-10-CM | POA: Diagnosis not present

## 2016-04-05 DIAGNOSIS — M7542 Impingement syndrome of left shoulder: Secondary | ICD-10-CM | POA: Diagnosis not present

## 2016-04-13 ENCOUNTER — Other Ambulatory Visit: Payer: Self-pay | Admitting: Internal Medicine

## 2016-04-13 DIAGNOSIS — M25512 Pain in left shoulder: Secondary | ICD-10-CM

## 2016-04-14 ENCOUNTER — Other Ambulatory Visit: Payer: Self-pay | Admitting: Internal Medicine

## 2016-04-14 ENCOUNTER — Ambulatory Visit
Admission: RE | Admit: 2016-04-14 | Discharge: 2016-04-14 | Disposition: A | Discharge status: Home / Self Care | Payer: BLUE CROSS/BLUE SHIELD | Source: Ambulatory Visit | Attending: Internal Medicine | Admitting: Internal Medicine

## 2016-04-14 DIAGNOSIS — M25512 Pain in left shoulder: Secondary | ICD-10-CM

## 2016-05-05 DIAGNOSIS — M25519 Pain in unspecified shoulder: Secondary | ICD-10-CM | POA: Diagnosis not present

## 2016-05-05 DIAGNOSIS — M7552 Bursitis of left shoulder: Secondary | ICD-10-CM | POA: Diagnosis not present

## 2016-05-05 DIAGNOSIS — M7542 Impingement syndrome of left shoulder: Secondary | ICD-10-CM | POA: Diagnosis not present

## 2016-05-26 DIAGNOSIS — M6281 Muscle weakness (generalized): Secondary | ICD-10-CM | POA: Diagnosis not present

## 2016-05-26 DIAGNOSIS — M25512 Pain in left shoulder: Secondary | ICD-10-CM | POA: Diagnosis not present

## 2016-05-31 DIAGNOSIS — M6281 Muscle weakness (generalized): Secondary | ICD-10-CM | POA: Diagnosis not present

## 2016-05-31 DIAGNOSIS — M25512 Pain in left shoulder: Secondary | ICD-10-CM | POA: Diagnosis not present

## 2016-06-03 DIAGNOSIS — M25512 Pain in left shoulder: Secondary | ICD-10-CM | POA: Diagnosis not present

## 2016-06-03 DIAGNOSIS — M6281 Muscle weakness (generalized): Secondary | ICD-10-CM | POA: Diagnosis not present

## 2016-06-08 DIAGNOSIS — M6281 Muscle weakness (generalized): Secondary | ICD-10-CM | POA: Diagnosis not present

## 2016-06-08 DIAGNOSIS — M25512 Pain in left shoulder: Secondary | ICD-10-CM | POA: Diagnosis not present

## 2016-06-10 DIAGNOSIS — M25512 Pain in left shoulder: Secondary | ICD-10-CM | POA: Diagnosis not present

## 2016-06-10 DIAGNOSIS — M6281 Muscle weakness (generalized): Secondary | ICD-10-CM | POA: Diagnosis not present

## 2016-06-16 DIAGNOSIS — M25512 Pain in left shoulder: Secondary | ICD-10-CM | POA: Diagnosis not present

## 2016-06-16 DIAGNOSIS — M6281 Muscle weakness (generalized): Secondary | ICD-10-CM | POA: Diagnosis not present

## 2016-06-30 DIAGNOSIS — M6281 Muscle weakness (generalized): Secondary | ICD-10-CM | POA: Diagnosis not present

## 2016-06-30 DIAGNOSIS — M25512 Pain in left shoulder: Secondary | ICD-10-CM | POA: Diagnosis not present

## 2016-07-14 DIAGNOSIS — Z0000 Encounter for general adult medical examination without abnormal findings: Secondary | ICD-10-CM | POA: Diagnosis not present

## 2016-07-14 DIAGNOSIS — E559 Vitamin D deficiency, unspecified: Secondary | ICD-10-CM | POA: Diagnosis not present

## 2016-07-19 DIAGNOSIS — E559 Vitamin D deficiency, unspecified: Secondary | ICD-10-CM | POA: Diagnosis not present

## 2016-07-19 DIAGNOSIS — Z01419 Encounter for gynecological examination (general) (routine) without abnormal findings: Secondary | ICD-10-CM | POA: Diagnosis not present

## 2016-07-19 DIAGNOSIS — Z1212 Encounter for screening for malignant neoplasm of rectum: Secondary | ICD-10-CM | POA: Diagnosis not present

## 2016-07-19 DIAGNOSIS — Z0000 Encounter for general adult medical examination without abnormal findings: Secondary | ICD-10-CM | POA: Diagnosis not present

## 2016-07-19 DIAGNOSIS — F419 Anxiety disorder, unspecified: Secondary | ICD-10-CM | POA: Diagnosis not present

## 2016-08-13 ENCOUNTER — Other Ambulatory Visit: Payer: Self-pay | Admitting: Internal Medicine

## 2016-08-13 DIAGNOSIS — Z1231 Encounter for screening mammogram for malignant neoplasm of breast: Secondary | ICD-10-CM

## 2016-08-30 DIAGNOSIS — N951 Menopausal and female climacteric states: Secondary | ICD-10-CM | POA: Diagnosis not present

## 2016-09-17 ENCOUNTER — Ambulatory Visit
Admission: RE | Admit: 2016-09-17 | Discharge: 2016-09-17 | Disposition: A | Discharge status: Home / Self Care | Payer: BLUE CROSS/BLUE SHIELD | Source: Ambulatory Visit | Attending: Internal Medicine | Admitting: Internal Medicine

## 2016-09-17 DIAGNOSIS — Z1231 Encounter for screening mammogram for malignant neoplasm of breast: Secondary | ICD-10-CM

## 2016-09-29 DIAGNOSIS — J069 Acute upper respiratory infection, unspecified: Secondary | ICD-10-CM | POA: Diagnosis not present

## 2016-09-29 DIAGNOSIS — J309 Allergic rhinitis, unspecified: Secondary | ICD-10-CM | POA: Diagnosis not present

## 2016-12-01 DIAGNOSIS — J3089 Other allergic rhinitis: Secondary | ICD-10-CM | POA: Diagnosis not present

## 2016-12-01 DIAGNOSIS — J3081 Allergic rhinitis due to animal (cat) (dog) hair and dander: Secondary | ICD-10-CM | POA: Diagnosis not present

## 2016-12-24 DIAGNOSIS — Z23 Encounter for immunization: Secondary | ICD-10-CM | POA: Diagnosis not present

## 2017-02-01 DIAGNOSIS — Z0184 Encounter for antibody response examination: Secondary | ICD-10-CM | POA: Diagnosis not present

## 2017-04-29 DIAGNOSIS — H40013 Open angle with borderline findings, low risk, bilateral: Secondary | ICD-10-CM | POA: Diagnosis not present

## 2017-06-06 DIAGNOSIS — J3089 Other allergic rhinitis: Secondary | ICD-10-CM | POA: Diagnosis not present

## 2017-06-06 DIAGNOSIS — J3081 Allergic rhinitis due to animal (cat) (dog) hair and dander: Secondary | ICD-10-CM | POA: Diagnosis not present

## 2017-06-06 DIAGNOSIS — J0100 Acute maxillary sinusitis, unspecified: Secondary | ICD-10-CM | POA: Diagnosis not present

## 2017-06-07 DIAGNOSIS — J0100 Acute maxillary sinusitis, unspecified: Secondary | ICD-10-CM | POA: Diagnosis not present

## 2017-06-17 DIAGNOSIS — Z23 Encounter for immunization: Secondary | ICD-10-CM | POA: Diagnosis not present

## 2017-06-23 DIAGNOSIS — J3089 Other allergic rhinitis: Secondary | ICD-10-CM | POA: Diagnosis not present

## 2017-06-23 DIAGNOSIS — J301 Allergic rhinitis due to pollen: Secondary | ICD-10-CM | POA: Diagnosis not present

## 2017-06-23 DIAGNOSIS — J3081 Allergic rhinitis due to animal (cat) (dog) hair and dander: Secondary | ICD-10-CM | POA: Diagnosis not present

## 2017-07-05 DIAGNOSIS — J3081 Allergic rhinitis due to animal (cat) (dog) hair and dander: Secondary | ICD-10-CM | POA: Diagnosis not present

## 2017-07-05 DIAGNOSIS — J301 Allergic rhinitis due to pollen: Secondary | ICD-10-CM | POA: Diagnosis not present

## 2017-07-06 DIAGNOSIS — J3089 Other allergic rhinitis: Secondary | ICD-10-CM | POA: Diagnosis not present

## 2017-07-13 DIAGNOSIS — J301 Allergic rhinitis due to pollen: Secondary | ICD-10-CM | POA: Diagnosis not present

## 2017-07-13 DIAGNOSIS — J3089 Other allergic rhinitis: Secondary | ICD-10-CM | POA: Diagnosis not present

## 2017-07-13 DIAGNOSIS — J3081 Allergic rhinitis due to animal (cat) (dog) hair and dander: Secondary | ICD-10-CM | POA: Diagnosis not present

## 2017-07-15 DIAGNOSIS — J3081 Allergic rhinitis due to animal (cat) (dog) hair and dander: Secondary | ICD-10-CM | POA: Diagnosis not present

## 2017-07-15 DIAGNOSIS — J3089 Other allergic rhinitis: Secondary | ICD-10-CM | POA: Diagnosis not present

## 2017-07-15 DIAGNOSIS — J301 Allergic rhinitis due to pollen: Secondary | ICD-10-CM | POA: Diagnosis not present

## 2017-07-19 DIAGNOSIS — J301 Allergic rhinitis due to pollen: Secondary | ICD-10-CM | POA: Diagnosis not present

## 2017-07-19 DIAGNOSIS — J3081 Allergic rhinitis due to animal (cat) (dog) hair and dander: Secondary | ICD-10-CM | POA: Diagnosis not present

## 2017-07-19 DIAGNOSIS — J3089 Other allergic rhinitis: Secondary | ICD-10-CM | POA: Diagnosis not present

## 2017-07-22 DIAGNOSIS — J3081 Allergic rhinitis due to animal (cat) (dog) hair and dander: Secondary | ICD-10-CM | POA: Diagnosis not present

## 2017-07-22 DIAGNOSIS — J301 Allergic rhinitis due to pollen: Secondary | ICD-10-CM | POA: Diagnosis not present

## 2017-07-22 DIAGNOSIS — J3089 Other allergic rhinitis: Secondary | ICD-10-CM | POA: Diagnosis not present

## 2017-07-26 DIAGNOSIS — J301 Allergic rhinitis due to pollen: Secondary | ICD-10-CM | POA: Diagnosis not present

## 2017-07-26 DIAGNOSIS — J3089 Other allergic rhinitis: Secondary | ICD-10-CM | POA: Diagnosis not present

## 2017-07-26 DIAGNOSIS — J3081 Allergic rhinitis due to animal (cat) (dog) hair and dander: Secondary | ICD-10-CM | POA: Diagnosis not present

## 2017-07-28 DIAGNOSIS — E559 Vitamin D deficiency, unspecified: Secondary | ICD-10-CM | POA: Diagnosis not present

## 2017-07-28 DIAGNOSIS — Z8639 Personal history of other endocrine, nutritional and metabolic disease: Secondary | ICD-10-CM | POA: Diagnosis not present

## 2017-07-28 DIAGNOSIS — E7800 Pure hypercholesterolemia, unspecified: Secondary | ICD-10-CM | POA: Diagnosis not present

## 2017-07-28 DIAGNOSIS — Z0000 Encounter for general adult medical examination without abnormal findings: Secondary | ICD-10-CM | POA: Diagnosis not present

## 2017-07-28 LAB — CBC AND DIFFERENTIAL
HCT: 40 (ref 36–46)
Hemoglobin: 13.5 (ref 12.0–16.0)
Neutrophils Absolute: 45.4
Platelets: 280 (ref 150–399)
WBC: 7.5

## 2017-07-28 LAB — VITAMIN D 25 HYDROXY (VIT D DEFICIENCY, FRACTURES): Vit D, 25-Hydroxy: 57

## 2017-07-28 LAB — HEPATIC FUNCTION PANEL
ALT: 22 (ref 7–35)
AST: 16 (ref 13–35)
Alkaline Phosphatase: 51 (ref 25–125)
Bilirubin, Total: 0.6

## 2017-07-28 LAB — BASIC METABOLIC PANEL
BUN: 13 (ref 4–21)
Chloride: 106 (ref 99–108)
Creatinine: 0.8 (ref 0.5–1.1)
Glucose: 78
Potassium: 3.9 (ref 3.4–5.3)
Sodium: 141 (ref 137–147)

## 2017-07-28 LAB — LIPID PANEL
Cholesterol: 208 — AB (ref 0–200)
HDL: 69 (ref 35–70)
LDL Cholesterol: 125
LDl/HDL Ratio: 1.8
Triglycerides: 69 (ref 40–160)

## 2017-07-28 LAB — COMPREHENSIVE METABOLIC PANEL
Albumin: 3.8 (ref 3.5–5.0)
Calcium: 8.8 (ref 8.7–10.7)
GFR calc Af Amer: 92.65
GFR calc non Af Amer: 76.44
Globulin: 3.4

## 2017-07-28 LAB — TSH: TSH: 1.77 (ref 0.41–5.90)

## 2017-07-28 LAB — CBC: RBC: 4.54 (ref 3.87–5.11)

## 2017-07-29 DIAGNOSIS — J3081 Allergic rhinitis due to animal (cat) (dog) hair and dander: Secondary | ICD-10-CM | POA: Diagnosis not present

## 2017-07-29 DIAGNOSIS — J301 Allergic rhinitis due to pollen: Secondary | ICD-10-CM | POA: Diagnosis not present

## 2017-07-29 DIAGNOSIS — J3089 Other allergic rhinitis: Secondary | ICD-10-CM | POA: Diagnosis not present

## 2017-08-02 DIAGNOSIS — J3081 Allergic rhinitis due to animal (cat) (dog) hair and dander: Secondary | ICD-10-CM | POA: Diagnosis not present

## 2017-08-02 DIAGNOSIS — J301 Allergic rhinitis due to pollen: Secondary | ICD-10-CM | POA: Diagnosis not present

## 2017-08-02 DIAGNOSIS — J3089 Other allergic rhinitis: Secondary | ICD-10-CM | POA: Diagnosis not present

## 2017-08-03 ENCOUNTER — Other Ambulatory Visit: Payer: Self-pay | Admitting: Internal Medicine

## 2017-08-03 DIAGNOSIS — Z01419 Encounter for gynecological examination (general) (routine) without abnormal findings: Secondary | ICD-10-CM | POA: Diagnosis not present

## 2017-08-03 DIAGNOSIS — Z0000 Encounter for general adult medical examination without abnormal findings: Secondary | ICD-10-CM | POA: Diagnosis not present

## 2017-08-03 DIAGNOSIS — M85859 Other specified disorders of bone density and structure, unspecified thigh: Secondary | ICD-10-CM | POA: Diagnosis not present

## 2017-08-03 DIAGNOSIS — Z1212 Encounter for screening for malignant neoplasm of rectum: Secondary | ICD-10-CM | POA: Diagnosis not present

## 2017-08-03 DIAGNOSIS — Z1231 Encounter for screening mammogram for malignant neoplasm of breast: Secondary | ICD-10-CM

## 2017-08-03 LAB — HM DEXA SCAN: HM Dexa Scan: NORMAL

## 2017-08-05 DIAGNOSIS — J3089 Other allergic rhinitis: Secondary | ICD-10-CM | POA: Diagnosis not present

## 2017-08-05 DIAGNOSIS — J301 Allergic rhinitis due to pollen: Secondary | ICD-10-CM | POA: Diagnosis not present

## 2017-08-05 DIAGNOSIS — J3081 Allergic rhinitis due to animal (cat) (dog) hair and dander: Secondary | ICD-10-CM | POA: Diagnosis not present

## 2017-08-09 DIAGNOSIS — J301 Allergic rhinitis due to pollen: Secondary | ICD-10-CM | POA: Diagnosis not present

## 2017-08-09 DIAGNOSIS — J3089 Other allergic rhinitis: Secondary | ICD-10-CM | POA: Diagnosis not present

## 2017-08-09 DIAGNOSIS — J3081 Allergic rhinitis due to animal (cat) (dog) hair and dander: Secondary | ICD-10-CM | POA: Diagnosis not present

## 2017-08-12 DIAGNOSIS — J3081 Allergic rhinitis due to animal (cat) (dog) hair and dander: Secondary | ICD-10-CM | POA: Diagnosis not present

## 2017-08-12 DIAGNOSIS — J301 Allergic rhinitis due to pollen: Secondary | ICD-10-CM | POA: Diagnosis not present

## 2017-08-12 DIAGNOSIS — J3089 Other allergic rhinitis: Secondary | ICD-10-CM | POA: Diagnosis not present

## 2017-08-16 DIAGNOSIS — J3081 Allergic rhinitis due to animal (cat) (dog) hair and dander: Secondary | ICD-10-CM | POA: Diagnosis not present

## 2017-08-16 DIAGNOSIS — J3089 Other allergic rhinitis: Secondary | ICD-10-CM | POA: Diagnosis not present

## 2017-08-16 DIAGNOSIS — J301 Allergic rhinitis due to pollen: Secondary | ICD-10-CM | POA: Diagnosis not present

## 2017-08-19 DIAGNOSIS — J301 Allergic rhinitis due to pollen: Secondary | ICD-10-CM | POA: Diagnosis not present

## 2017-08-19 DIAGNOSIS — J3089 Other allergic rhinitis: Secondary | ICD-10-CM | POA: Diagnosis not present

## 2017-08-19 DIAGNOSIS — J3081 Allergic rhinitis due to animal (cat) (dog) hair and dander: Secondary | ICD-10-CM | POA: Diagnosis not present

## 2017-08-23 DIAGNOSIS — J301 Allergic rhinitis due to pollen: Secondary | ICD-10-CM | POA: Diagnosis not present

## 2017-08-23 DIAGNOSIS — J3081 Allergic rhinitis due to animal (cat) (dog) hair and dander: Secondary | ICD-10-CM | POA: Diagnosis not present

## 2017-08-23 DIAGNOSIS — J3089 Other allergic rhinitis: Secondary | ICD-10-CM | POA: Diagnosis not present

## 2017-08-26 DIAGNOSIS — J3089 Other allergic rhinitis: Secondary | ICD-10-CM | POA: Diagnosis not present

## 2017-08-26 DIAGNOSIS — J3081 Allergic rhinitis due to animal (cat) (dog) hair and dander: Secondary | ICD-10-CM | POA: Diagnosis not present

## 2017-08-26 DIAGNOSIS — J301 Allergic rhinitis due to pollen: Secondary | ICD-10-CM | POA: Diagnosis not present

## 2017-08-30 DIAGNOSIS — J3089 Other allergic rhinitis: Secondary | ICD-10-CM | POA: Diagnosis not present

## 2017-08-30 DIAGNOSIS — J3081 Allergic rhinitis due to animal (cat) (dog) hair and dander: Secondary | ICD-10-CM | POA: Diagnosis not present

## 2017-08-30 DIAGNOSIS — J301 Allergic rhinitis due to pollen: Secondary | ICD-10-CM | POA: Diagnosis not present

## 2017-09-02 DIAGNOSIS — J3089 Other allergic rhinitis: Secondary | ICD-10-CM | POA: Diagnosis not present

## 2017-09-02 DIAGNOSIS — J301 Allergic rhinitis due to pollen: Secondary | ICD-10-CM | POA: Diagnosis not present

## 2017-09-02 DIAGNOSIS — J3081 Allergic rhinitis due to animal (cat) (dog) hair and dander: Secondary | ICD-10-CM | POA: Diagnosis not present

## 2017-09-06 DIAGNOSIS — J301 Allergic rhinitis due to pollen: Secondary | ICD-10-CM | POA: Diagnosis not present

## 2017-09-06 DIAGNOSIS — J3089 Other allergic rhinitis: Secondary | ICD-10-CM | POA: Diagnosis not present

## 2017-09-06 DIAGNOSIS — J3081 Allergic rhinitis due to animal (cat) (dog) hair and dander: Secondary | ICD-10-CM | POA: Diagnosis not present

## 2017-09-09 DIAGNOSIS — J301 Allergic rhinitis due to pollen: Secondary | ICD-10-CM | POA: Diagnosis not present

## 2017-09-09 DIAGNOSIS — J3081 Allergic rhinitis due to animal (cat) (dog) hair and dander: Secondary | ICD-10-CM | POA: Diagnosis not present

## 2017-09-09 DIAGNOSIS — J3089 Other allergic rhinitis: Secondary | ICD-10-CM | POA: Diagnosis not present

## 2017-09-09 DIAGNOSIS — J0100 Acute maxillary sinusitis, unspecified: Secondary | ICD-10-CM | POA: Diagnosis not present

## 2017-09-13 DIAGNOSIS — J3089 Other allergic rhinitis: Secondary | ICD-10-CM | POA: Diagnosis not present

## 2017-09-13 DIAGNOSIS — J301 Allergic rhinitis due to pollen: Secondary | ICD-10-CM | POA: Diagnosis not present

## 2017-09-13 DIAGNOSIS — J3081 Allergic rhinitis due to animal (cat) (dog) hair and dander: Secondary | ICD-10-CM | POA: Diagnosis not present

## 2017-09-16 DIAGNOSIS — J3089 Other allergic rhinitis: Secondary | ICD-10-CM | POA: Diagnosis not present

## 2017-09-16 DIAGNOSIS — J3081 Allergic rhinitis due to animal (cat) (dog) hair and dander: Secondary | ICD-10-CM | POA: Diagnosis not present

## 2017-09-16 DIAGNOSIS — J301 Allergic rhinitis due to pollen: Secondary | ICD-10-CM | POA: Diagnosis not present

## 2017-09-19 DIAGNOSIS — J3089 Other allergic rhinitis: Secondary | ICD-10-CM | POA: Diagnosis not present

## 2017-09-19 DIAGNOSIS — J301 Allergic rhinitis due to pollen: Secondary | ICD-10-CM | POA: Diagnosis not present

## 2017-09-19 DIAGNOSIS — J3081 Allergic rhinitis due to animal (cat) (dog) hair and dander: Secondary | ICD-10-CM | POA: Diagnosis not present

## 2017-09-21 DIAGNOSIS — J301 Allergic rhinitis due to pollen: Secondary | ICD-10-CM | POA: Diagnosis not present

## 2017-09-21 DIAGNOSIS — J3089 Other allergic rhinitis: Secondary | ICD-10-CM | POA: Diagnosis not present

## 2017-09-21 DIAGNOSIS — J3081 Allergic rhinitis due to animal (cat) (dog) hair and dander: Secondary | ICD-10-CM | POA: Diagnosis not present

## 2017-09-23 ENCOUNTER — Ambulatory Visit
Admission: RE | Admit: 2017-09-23 | Discharge: 2017-09-23 | Disposition: A | Discharge status: Home / Self Care | Payer: BLUE CROSS/BLUE SHIELD | Source: Ambulatory Visit | Attending: Internal Medicine | Admitting: Internal Medicine

## 2017-09-23 DIAGNOSIS — Z1231 Encounter for screening mammogram for malignant neoplasm of breast: Secondary | ICD-10-CM | POA: Diagnosis not present

## 2017-09-26 DIAGNOSIS — N951 Menopausal and female climacteric states: Secondary | ICD-10-CM | POA: Diagnosis not present

## 2017-09-26 DIAGNOSIS — L292 Pruritus vulvae: Secondary | ICD-10-CM | POA: Diagnosis not present

## 2017-09-26 DIAGNOSIS — N9089 Other specified noninflammatory disorders of vulva and perineum: Secondary | ICD-10-CM | POA: Diagnosis not present

## 2017-09-27 DIAGNOSIS — J3081 Allergic rhinitis due to animal (cat) (dog) hair and dander: Secondary | ICD-10-CM | POA: Diagnosis not present

## 2017-09-27 DIAGNOSIS — J301 Allergic rhinitis due to pollen: Secondary | ICD-10-CM | POA: Diagnosis not present

## 2017-09-27 DIAGNOSIS — J3089 Other allergic rhinitis: Secondary | ICD-10-CM | POA: Diagnosis not present

## 2017-09-30 DIAGNOSIS — J3081 Allergic rhinitis due to animal (cat) (dog) hair and dander: Secondary | ICD-10-CM | POA: Diagnosis not present

## 2017-09-30 DIAGNOSIS — J3089 Other allergic rhinitis: Secondary | ICD-10-CM | POA: Diagnosis not present

## 2017-09-30 DIAGNOSIS — J301 Allergic rhinitis due to pollen: Secondary | ICD-10-CM | POA: Diagnosis not present

## 2017-10-04 DIAGNOSIS — J3089 Other allergic rhinitis: Secondary | ICD-10-CM | POA: Diagnosis not present

## 2017-10-04 DIAGNOSIS — J3081 Allergic rhinitis due to animal (cat) (dog) hair and dander: Secondary | ICD-10-CM | POA: Diagnosis not present

## 2017-10-04 DIAGNOSIS — J301 Allergic rhinitis due to pollen: Secondary | ICD-10-CM | POA: Diagnosis not present

## 2017-10-07 DIAGNOSIS — J3089 Other allergic rhinitis: Secondary | ICD-10-CM | POA: Diagnosis not present

## 2017-10-07 DIAGNOSIS — J3081 Allergic rhinitis due to animal (cat) (dog) hair and dander: Secondary | ICD-10-CM | POA: Diagnosis not present

## 2017-10-07 DIAGNOSIS — J301 Allergic rhinitis due to pollen: Secondary | ICD-10-CM | POA: Diagnosis not present

## 2017-10-11 DIAGNOSIS — J3089 Other allergic rhinitis: Secondary | ICD-10-CM | POA: Diagnosis not present

## 2017-10-11 DIAGNOSIS — J3081 Allergic rhinitis due to animal (cat) (dog) hair and dander: Secondary | ICD-10-CM | POA: Diagnosis not present

## 2017-10-11 DIAGNOSIS — J301 Allergic rhinitis due to pollen: Secondary | ICD-10-CM | POA: Diagnosis not present

## 2017-10-18 DIAGNOSIS — J3089 Other allergic rhinitis: Secondary | ICD-10-CM | POA: Diagnosis not present

## 2017-10-18 DIAGNOSIS — J301 Allergic rhinitis due to pollen: Secondary | ICD-10-CM | POA: Diagnosis not present

## 2017-10-18 DIAGNOSIS — J3081 Allergic rhinitis due to animal (cat) (dog) hair and dander: Secondary | ICD-10-CM | POA: Diagnosis not present

## 2017-10-20 DIAGNOSIS — J3081 Allergic rhinitis due to animal (cat) (dog) hair and dander: Secondary | ICD-10-CM | POA: Diagnosis not present

## 2017-10-20 DIAGNOSIS — J301 Allergic rhinitis due to pollen: Secondary | ICD-10-CM | POA: Diagnosis not present

## 2017-10-21 DIAGNOSIS — J3089 Other allergic rhinitis: Secondary | ICD-10-CM | POA: Diagnosis not present

## 2017-10-25 DIAGNOSIS — J3089 Other allergic rhinitis: Secondary | ICD-10-CM | POA: Diagnosis not present

## 2017-10-25 DIAGNOSIS — J3081 Allergic rhinitis due to animal (cat) (dog) hair and dander: Secondary | ICD-10-CM | POA: Diagnosis not present

## 2017-10-25 DIAGNOSIS — J301 Allergic rhinitis due to pollen: Secondary | ICD-10-CM | POA: Diagnosis not present

## 2017-11-01 DIAGNOSIS — J3089 Other allergic rhinitis: Secondary | ICD-10-CM | POA: Diagnosis not present

## 2017-11-01 DIAGNOSIS — J301 Allergic rhinitis due to pollen: Secondary | ICD-10-CM | POA: Diagnosis not present

## 2017-11-01 DIAGNOSIS — J3081 Allergic rhinitis due to animal (cat) (dog) hair and dander: Secondary | ICD-10-CM | POA: Diagnosis not present

## 2017-11-08 DIAGNOSIS — J3089 Other allergic rhinitis: Secondary | ICD-10-CM | POA: Diagnosis not present

## 2017-11-08 DIAGNOSIS — J3081 Allergic rhinitis due to animal (cat) (dog) hair and dander: Secondary | ICD-10-CM | POA: Diagnosis not present

## 2017-11-08 DIAGNOSIS — J301 Allergic rhinitis due to pollen: Secondary | ICD-10-CM | POA: Diagnosis not present

## 2017-11-15 DIAGNOSIS — J301 Allergic rhinitis due to pollen: Secondary | ICD-10-CM | POA: Diagnosis not present

## 2017-11-15 DIAGNOSIS — J3081 Allergic rhinitis due to animal (cat) (dog) hair and dander: Secondary | ICD-10-CM | POA: Diagnosis not present

## 2017-11-15 DIAGNOSIS — J3089 Other allergic rhinitis: Secondary | ICD-10-CM | POA: Diagnosis not present

## 2017-11-22 DIAGNOSIS — J301 Allergic rhinitis due to pollen: Secondary | ICD-10-CM | POA: Diagnosis not present

## 2017-11-22 DIAGNOSIS — J3081 Allergic rhinitis due to animal (cat) (dog) hair and dander: Secondary | ICD-10-CM | POA: Diagnosis not present

## 2017-11-22 DIAGNOSIS — J3089 Other allergic rhinitis: Secondary | ICD-10-CM | POA: Diagnosis not present

## 2017-11-29 DIAGNOSIS — J3089 Other allergic rhinitis: Secondary | ICD-10-CM | POA: Diagnosis not present

## 2017-11-29 DIAGNOSIS — J301 Allergic rhinitis due to pollen: Secondary | ICD-10-CM | POA: Diagnosis not present

## 2017-11-29 DIAGNOSIS — J3081 Allergic rhinitis due to animal (cat) (dog) hair and dander: Secondary | ICD-10-CM | POA: Diagnosis not present

## 2017-12-06 DIAGNOSIS — J3089 Other allergic rhinitis: Secondary | ICD-10-CM | POA: Diagnosis not present

## 2017-12-06 DIAGNOSIS — J3081 Allergic rhinitis due to animal (cat) (dog) hair and dander: Secondary | ICD-10-CM | POA: Diagnosis not present

## 2017-12-06 DIAGNOSIS — J301 Allergic rhinitis due to pollen: Secondary | ICD-10-CM | POA: Diagnosis not present

## 2017-12-13 DIAGNOSIS — J3089 Other allergic rhinitis: Secondary | ICD-10-CM | POA: Diagnosis not present

## 2017-12-13 DIAGNOSIS — J3081 Allergic rhinitis due to animal (cat) (dog) hair and dander: Secondary | ICD-10-CM | POA: Diagnosis not present

## 2017-12-13 DIAGNOSIS — J301 Allergic rhinitis due to pollen: Secondary | ICD-10-CM | POA: Diagnosis not present

## 2017-12-14 DIAGNOSIS — J301 Allergic rhinitis due to pollen: Secondary | ICD-10-CM | POA: Diagnosis not present

## 2017-12-14 DIAGNOSIS — J3089 Other allergic rhinitis: Secondary | ICD-10-CM | POA: Diagnosis not present

## 2017-12-14 DIAGNOSIS — J3081 Allergic rhinitis due to animal (cat) (dog) hair and dander: Secondary | ICD-10-CM | POA: Diagnosis not present

## 2017-12-20 DIAGNOSIS — J3089 Other allergic rhinitis: Secondary | ICD-10-CM | POA: Diagnosis not present

## 2017-12-20 DIAGNOSIS — J301 Allergic rhinitis due to pollen: Secondary | ICD-10-CM | POA: Diagnosis not present

## 2017-12-20 DIAGNOSIS — J3081 Allergic rhinitis due to animal (cat) (dog) hair and dander: Secondary | ICD-10-CM | POA: Diagnosis not present

## 2017-12-23 DIAGNOSIS — Z23 Encounter for immunization: Secondary | ICD-10-CM | POA: Diagnosis not present

## 2017-12-27 DIAGNOSIS — J3081 Allergic rhinitis due to animal (cat) (dog) hair and dander: Secondary | ICD-10-CM | POA: Diagnosis not present

## 2017-12-27 DIAGNOSIS — J3089 Other allergic rhinitis: Secondary | ICD-10-CM | POA: Diagnosis not present

## 2017-12-27 DIAGNOSIS — J301 Allergic rhinitis due to pollen: Secondary | ICD-10-CM | POA: Diagnosis not present

## 2018-01-03 DIAGNOSIS — J3081 Allergic rhinitis due to animal (cat) (dog) hair and dander: Secondary | ICD-10-CM | POA: Diagnosis not present

## 2018-01-03 DIAGNOSIS — J3089 Other allergic rhinitis: Secondary | ICD-10-CM | POA: Diagnosis not present

## 2018-01-03 DIAGNOSIS — J301 Allergic rhinitis due to pollen: Secondary | ICD-10-CM | POA: Diagnosis not present

## 2018-01-10 DIAGNOSIS — J301 Allergic rhinitis due to pollen: Secondary | ICD-10-CM | POA: Diagnosis not present

## 2018-01-10 DIAGNOSIS — J3081 Allergic rhinitis due to animal (cat) (dog) hair and dander: Secondary | ICD-10-CM | POA: Diagnosis not present

## 2018-01-10 DIAGNOSIS — J3089 Other allergic rhinitis: Secondary | ICD-10-CM | POA: Diagnosis not present

## 2018-01-17 DIAGNOSIS — J301 Allergic rhinitis due to pollen: Secondary | ICD-10-CM | POA: Diagnosis not present

## 2018-01-17 DIAGNOSIS — J3081 Allergic rhinitis due to animal (cat) (dog) hair and dander: Secondary | ICD-10-CM | POA: Diagnosis not present

## 2018-01-17 DIAGNOSIS — J3089 Other allergic rhinitis: Secondary | ICD-10-CM | POA: Diagnosis not present

## 2018-01-24 DIAGNOSIS — J301 Allergic rhinitis due to pollen: Secondary | ICD-10-CM | POA: Diagnosis not present

## 2018-01-24 DIAGNOSIS — J3081 Allergic rhinitis due to animal (cat) (dog) hair and dander: Secondary | ICD-10-CM | POA: Diagnosis not present

## 2018-01-24 DIAGNOSIS — J3089 Other allergic rhinitis: Secondary | ICD-10-CM | POA: Diagnosis not present

## 2018-01-31 DIAGNOSIS — J3081 Allergic rhinitis due to animal (cat) (dog) hair and dander: Secondary | ICD-10-CM | POA: Diagnosis not present

## 2018-01-31 DIAGNOSIS — J3089 Other allergic rhinitis: Secondary | ICD-10-CM | POA: Diagnosis not present

## 2018-01-31 DIAGNOSIS — J301 Allergic rhinitis due to pollen: Secondary | ICD-10-CM | POA: Diagnosis not present

## 2018-02-07 DIAGNOSIS — J301 Allergic rhinitis due to pollen: Secondary | ICD-10-CM | POA: Diagnosis not present

## 2018-02-07 DIAGNOSIS — J3089 Other allergic rhinitis: Secondary | ICD-10-CM | POA: Diagnosis not present

## 2018-02-07 DIAGNOSIS — J3081 Allergic rhinitis due to animal (cat) (dog) hair and dander: Secondary | ICD-10-CM | POA: Diagnosis not present

## 2018-02-14 DIAGNOSIS — J301 Allergic rhinitis due to pollen: Secondary | ICD-10-CM | POA: Diagnosis not present

## 2018-02-14 DIAGNOSIS — J3089 Other allergic rhinitis: Secondary | ICD-10-CM | POA: Diagnosis not present

## 2018-02-14 DIAGNOSIS — J3081 Allergic rhinitis due to animal (cat) (dog) hair and dander: Secondary | ICD-10-CM | POA: Diagnosis not present

## 2018-02-21 DIAGNOSIS — J3081 Allergic rhinitis due to animal (cat) (dog) hair and dander: Secondary | ICD-10-CM | POA: Diagnosis not present

## 2018-02-21 DIAGNOSIS — J301 Allergic rhinitis due to pollen: Secondary | ICD-10-CM | POA: Diagnosis not present

## 2018-02-21 DIAGNOSIS — J3089 Other allergic rhinitis: Secondary | ICD-10-CM | POA: Diagnosis not present

## 2018-02-28 DIAGNOSIS — J3089 Other allergic rhinitis: Secondary | ICD-10-CM | POA: Diagnosis not present

## 2018-02-28 DIAGNOSIS — J301 Allergic rhinitis due to pollen: Secondary | ICD-10-CM | POA: Diagnosis not present

## 2018-02-28 DIAGNOSIS — J3081 Allergic rhinitis due to animal (cat) (dog) hair and dander: Secondary | ICD-10-CM | POA: Diagnosis not present

## 2018-03-01 DIAGNOSIS — J3089 Other allergic rhinitis: Secondary | ICD-10-CM | POA: Diagnosis not present

## 2018-03-07 DIAGNOSIS — J301 Allergic rhinitis due to pollen: Secondary | ICD-10-CM | POA: Diagnosis not present

## 2018-03-07 DIAGNOSIS — J3089 Other allergic rhinitis: Secondary | ICD-10-CM | POA: Diagnosis not present

## 2018-03-07 DIAGNOSIS — J3081 Allergic rhinitis due to animal (cat) (dog) hair and dander: Secondary | ICD-10-CM | POA: Diagnosis not present

## 2018-03-14 DIAGNOSIS — J3081 Allergic rhinitis due to animal (cat) (dog) hair and dander: Secondary | ICD-10-CM | POA: Diagnosis not present

## 2018-03-14 DIAGNOSIS — J3089 Other allergic rhinitis: Secondary | ICD-10-CM | POA: Diagnosis not present

## 2018-03-14 DIAGNOSIS — J301 Allergic rhinitis due to pollen: Secondary | ICD-10-CM | POA: Diagnosis not present

## 2018-03-21 DIAGNOSIS — J3089 Other allergic rhinitis: Secondary | ICD-10-CM | POA: Diagnosis not present

## 2018-03-21 DIAGNOSIS — J3081 Allergic rhinitis due to animal (cat) (dog) hair and dander: Secondary | ICD-10-CM | POA: Diagnosis not present

## 2018-03-21 DIAGNOSIS — J301 Allergic rhinitis due to pollen: Secondary | ICD-10-CM | POA: Diagnosis not present

## 2018-03-30 DIAGNOSIS — J3081 Allergic rhinitis due to animal (cat) (dog) hair and dander: Secondary | ICD-10-CM | POA: Diagnosis not present

## 2018-03-30 DIAGNOSIS — J301 Allergic rhinitis due to pollen: Secondary | ICD-10-CM | POA: Diagnosis not present

## 2018-03-30 DIAGNOSIS — J3089 Other allergic rhinitis: Secondary | ICD-10-CM | POA: Diagnosis not present

## 2018-04-06 DIAGNOSIS — J3081 Allergic rhinitis due to animal (cat) (dog) hair and dander: Secondary | ICD-10-CM | POA: Diagnosis not present

## 2018-04-06 DIAGNOSIS — J3089 Other allergic rhinitis: Secondary | ICD-10-CM | POA: Diagnosis not present

## 2018-04-06 DIAGNOSIS — J301 Allergic rhinitis due to pollen: Secondary | ICD-10-CM | POA: Diagnosis not present

## 2018-04-13 DIAGNOSIS — J3089 Other allergic rhinitis: Secondary | ICD-10-CM | POA: Diagnosis not present

## 2018-04-13 DIAGNOSIS — J301 Allergic rhinitis due to pollen: Secondary | ICD-10-CM | POA: Diagnosis not present

## 2018-04-13 DIAGNOSIS — J3081 Allergic rhinitis due to animal (cat) (dog) hair and dander: Secondary | ICD-10-CM | POA: Diagnosis not present

## 2018-04-20 DIAGNOSIS — J3081 Allergic rhinitis due to animal (cat) (dog) hair and dander: Secondary | ICD-10-CM | POA: Diagnosis not present

## 2018-04-20 DIAGNOSIS — J3089 Other allergic rhinitis: Secondary | ICD-10-CM | POA: Diagnosis not present

## 2018-04-20 DIAGNOSIS — J301 Allergic rhinitis due to pollen: Secondary | ICD-10-CM | POA: Diagnosis not present

## 2018-04-27 DIAGNOSIS — J301 Allergic rhinitis due to pollen: Secondary | ICD-10-CM | POA: Diagnosis not present

## 2018-04-27 DIAGNOSIS — J3089 Other allergic rhinitis: Secondary | ICD-10-CM | POA: Diagnosis not present

## 2018-04-27 DIAGNOSIS — J3081 Allergic rhinitis due to animal (cat) (dog) hair and dander: Secondary | ICD-10-CM | POA: Diagnosis not present

## 2018-05-04 DIAGNOSIS — J3089 Other allergic rhinitis: Secondary | ICD-10-CM | POA: Diagnosis not present

## 2018-05-04 DIAGNOSIS — J301 Allergic rhinitis due to pollen: Secondary | ICD-10-CM | POA: Diagnosis not present

## 2018-05-04 DIAGNOSIS — J3081 Allergic rhinitis due to animal (cat) (dog) hair and dander: Secondary | ICD-10-CM | POA: Diagnosis not present

## 2018-05-11 DIAGNOSIS — J301 Allergic rhinitis due to pollen: Secondary | ICD-10-CM | POA: Diagnosis not present

## 2018-05-11 DIAGNOSIS — J3089 Other allergic rhinitis: Secondary | ICD-10-CM | POA: Diagnosis not present

## 2018-05-11 DIAGNOSIS — J3081 Allergic rhinitis due to animal (cat) (dog) hair and dander: Secondary | ICD-10-CM | POA: Diagnosis not present

## 2018-05-18 DIAGNOSIS — J3081 Allergic rhinitis due to animal (cat) (dog) hair and dander: Secondary | ICD-10-CM | POA: Diagnosis not present

## 2018-05-18 DIAGNOSIS — J301 Allergic rhinitis due to pollen: Secondary | ICD-10-CM | POA: Diagnosis not present

## 2018-05-18 DIAGNOSIS — J3089 Other allergic rhinitis: Secondary | ICD-10-CM | POA: Diagnosis not present

## 2018-05-25 DIAGNOSIS — J301 Allergic rhinitis due to pollen: Secondary | ICD-10-CM | POA: Diagnosis not present

## 2018-05-25 DIAGNOSIS — J3089 Other allergic rhinitis: Secondary | ICD-10-CM | POA: Diagnosis not present

## 2018-05-25 DIAGNOSIS — J3081 Allergic rhinitis due to animal (cat) (dog) hair and dander: Secondary | ICD-10-CM | POA: Diagnosis not present

## 2018-06-01 DIAGNOSIS — J301 Allergic rhinitis due to pollen: Secondary | ICD-10-CM | POA: Diagnosis not present

## 2018-06-01 DIAGNOSIS — J3081 Allergic rhinitis due to animal (cat) (dog) hair and dander: Secondary | ICD-10-CM | POA: Diagnosis not present

## 2018-06-01 DIAGNOSIS — J3089 Other allergic rhinitis: Secondary | ICD-10-CM | POA: Diagnosis not present

## 2018-06-08 DIAGNOSIS — J301 Allergic rhinitis due to pollen: Secondary | ICD-10-CM | POA: Diagnosis not present

## 2018-06-08 DIAGNOSIS — J3081 Allergic rhinitis due to animal (cat) (dog) hair and dander: Secondary | ICD-10-CM | POA: Diagnosis not present

## 2018-06-08 DIAGNOSIS — J3089 Other allergic rhinitis: Secondary | ICD-10-CM | POA: Diagnosis not present

## 2018-06-15 DIAGNOSIS — J3089 Other allergic rhinitis: Secondary | ICD-10-CM | POA: Diagnosis not present

## 2018-06-15 DIAGNOSIS — J301 Allergic rhinitis due to pollen: Secondary | ICD-10-CM | POA: Diagnosis not present

## 2018-06-15 DIAGNOSIS — J3081 Allergic rhinitis due to animal (cat) (dog) hair and dander: Secondary | ICD-10-CM | POA: Diagnosis not present

## 2018-06-22 DIAGNOSIS — J3081 Allergic rhinitis due to animal (cat) (dog) hair and dander: Secondary | ICD-10-CM | POA: Diagnosis not present

## 2018-06-22 DIAGNOSIS — J3089 Other allergic rhinitis: Secondary | ICD-10-CM | POA: Diagnosis not present

## 2018-06-22 DIAGNOSIS — J301 Allergic rhinitis due to pollen: Secondary | ICD-10-CM | POA: Diagnosis not present

## 2018-06-29 DIAGNOSIS — J3089 Other allergic rhinitis: Secondary | ICD-10-CM | POA: Diagnosis not present

## 2018-06-29 DIAGNOSIS — J3081 Allergic rhinitis due to animal (cat) (dog) hair and dander: Secondary | ICD-10-CM | POA: Diagnosis not present

## 2018-06-29 DIAGNOSIS — J301 Allergic rhinitis due to pollen: Secondary | ICD-10-CM | POA: Diagnosis not present

## 2018-07-06 DIAGNOSIS — J3081 Allergic rhinitis due to animal (cat) (dog) hair and dander: Secondary | ICD-10-CM | POA: Diagnosis not present

## 2018-07-06 DIAGNOSIS — J301 Allergic rhinitis due to pollen: Secondary | ICD-10-CM | POA: Diagnosis not present

## 2018-07-06 DIAGNOSIS — J3089 Other allergic rhinitis: Secondary | ICD-10-CM | POA: Diagnosis not present

## 2018-07-13 DIAGNOSIS — J3081 Allergic rhinitis due to animal (cat) (dog) hair and dander: Secondary | ICD-10-CM | POA: Diagnosis not present

## 2018-07-13 DIAGNOSIS — J3089 Other allergic rhinitis: Secondary | ICD-10-CM | POA: Diagnosis not present

## 2018-07-13 DIAGNOSIS — J301 Allergic rhinitis due to pollen: Secondary | ICD-10-CM | POA: Diagnosis not present

## 2018-07-20 DIAGNOSIS — J3089 Other allergic rhinitis: Secondary | ICD-10-CM | POA: Diagnosis not present

## 2018-07-20 DIAGNOSIS — J301 Allergic rhinitis due to pollen: Secondary | ICD-10-CM | POA: Diagnosis not present

## 2018-07-20 DIAGNOSIS — J3081 Allergic rhinitis due to animal (cat) (dog) hair and dander: Secondary | ICD-10-CM | POA: Diagnosis not present

## 2018-07-27 DIAGNOSIS — J3081 Allergic rhinitis due to animal (cat) (dog) hair and dander: Secondary | ICD-10-CM | POA: Diagnosis not present

## 2018-07-27 DIAGNOSIS — J3089 Other allergic rhinitis: Secondary | ICD-10-CM | POA: Diagnosis not present

## 2018-07-27 DIAGNOSIS — J301 Allergic rhinitis due to pollen: Secondary | ICD-10-CM | POA: Diagnosis not present

## 2018-08-03 DIAGNOSIS — J3089 Other allergic rhinitis: Secondary | ICD-10-CM | POA: Diagnosis not present

## 2018-08-03 DIAGNOSIS — J301 Allergic rhinitis due to pollen: Secondary | ICD-10-CM | POA: Diagnosis not present

## 2018-08-03 DIAGNOSIS — J3081 Allergic rhinitis due to animal (cat) (dog) hair and dander: Secondary | ICD-10-CM | POA: Diagnosis not present

## 2018-08-09 DIAGNOSIS — Z0000 Encounter for general adult medical examination without abnormal findings: Secondary | ICD-10-CM | POA: Diagnosis not present

## 2018-08-09 DIAGNOSIS — Z8639 Personal history of other endocrine, nutritional and metabolic disease: Secondary | ICD-10-CM | POA: Diagnosis not present

## 2018-08-09 DIAGNOSIS — E559 Vitamin D deficiency, unspecified: Secondary | ICD-10-CM | POA: Diagnosis not present

## 2018-08-09 DIAGNOSIS — E789 Disorder of lipoprotein metabolism, unspecified: Secondary | ICD-10-CM | POA: Diagnosis not present

## 2018-08-10 DIAGNOSIS — J3081 Allergic rhinitis due to animal (cat) (dog) hair and dander: Secondary | ICD-10-CM | POA: Diagnosis not present

## 2018-08-10 DIAGNOSIS — J3089 Other allergic rhinitis: Secondary | ICD-10-CM | POA: Diagnosis not present

## 2018-08-10 DIAGNOSIS — J301 Allergic rhinitis due to pollen: Secondary | ICD-10-CM | POA: Diagnosis not present

## 2018-08-11 DIAGNOSIS — Z1212 Encounter for screening for malignant neoplasm of rectum: Secondary | ICD-10-CM | POA: Diagnosis not present

## 2018-08-11 DIAGNOSIS — Z8639 Personal history of other endocrine, nutritional and metabolic disease: Secondary | ICD-10-CM | POA: Diagnosis not present

## 2018-08-11 DIAGNOSIS — Z0000 Encounter for general adult medical examination without abnormal findings: Secondary | ICD-10-CM | POA: Diagnosis not present

## 2018-08-17 DIAGNOSIS — J3081 Allergic rhinitis due to animal (cat) (dog) hair and dander: Secondary | ICD-10-CM | POA: Diagnosis not present

## 2018-08-17 DIAGNOSIS — J301 Allergic rhinitis due to pollen: Secondary | ICD-10-CM | POA: Diagnosis not present

## 2018-08-17 DIAGNOSIS — J3089 Other allergic rhinitis: Secondary | ICD-10-CM | POA: Diagnosis not present

## 2018-08-24 DIAGNOSIS — J301 Allergic rhinitis due to pollen: Secondary | ICD-10-CM | POA: Diagnosis not present

## 2018-08-24 DIAGNOSIS — J3081 Allergic rhinitis due to animal (cat) (dog) hair and dander: Secondary | ICD-10-CM | POA: Diagnosis not present

## 2018-08-24 DIAGNOSIS — J3089 Other allergic rhinitis: Secondary | ICD-10-CM | POA: Diagnosis not present

## 2018-08-25 ENCOUNTER — Other Ambulatory Visit: Payer: Self-pay | Admitting: Internal Medicine

## 2018-08-25 DIAGNOSIS — Z1231 Encounter for screening mammogram for malignant neoplasm of breast: Secondary | ICD-10-CM

## 2018-08-28 DIAGNOSIS — J301 Allergic rhinitis due to pollen: Secondary | ICD-10-CM | POA: Diagnosis not present

## 2018-08-28 DIAGNOSIS — J3089 Other allergic rhinitis: Secondary | ICD-10-CM | POA: Diagnosis not present

## 2018-08-28 DIAGNOSIS — J3081 Allergic rhinitis due to animal (cat) (dog) hair and dander: Secondary | ICD-10-CM | POA: Diagnosis not present

## 2018-08-31 DIAGNOSIS — J301 Allergic rhinitis due to pollen: Secondary | ICD-10-CM | POA: Diagnosis not present

## 2018-08-31 DIAGNOSIS — J3081 Allergic rhinitis due to animal (cat) (dog) hair and dander: Secondary | ICD-10-CM | POA: Diagnosis not present

## 2018-08-31 DIAGNOSIS — J3089 Other allergic rhinitis: Secondary | ICD-10-CM | POA: Diagnosis not present

## 2018-09-07 DIAGNOSIS — J301 Allergic rhinitis due to pollen: Secondary | ICD-10-CM | POA: Diagnosis not present

## 2018-09-07 DIAGNOSIS — J3089 Other allergic rhinitis: Secondary | ICD-10-CM | POA: Diagnosis not present

## 2018-09-07 DIAGNOSIS — J3081 Allergic rhinitis due to animal (cat) (dog) hair and dander: Secondary | ICD-10-CM | POA: Diagnosis not present

## 2018-09-14 DIAGNOSIS — J301 Allergic rhinitis due to pollen: Secondary | ICD-10-CM | POA: Diagnosis not present

## 2018-09-14 DIAGNOSIS — J3081 Allergic rhinitis due to animal (cat) (dog) hair and dander: Secondary | ICD-10-CM | POA: Diagnosis not present

## 2018-09-14 DIAGNOSIS — J3089 Other allergic rhinitis: Secondary | ICD-10-CM | POA: Diagnosis not present

## 2018-09-21 DIAGNOSIS — J3081 Allergic rhinitis due to animal (cat) (dog) hair and dander: Secondary | ICD-10-CM | POA: Diagnosis not present

## 2018-09-21 DIAGNOSIS — J301 Allergic rhinitis due to pollen: Secondary | ICD-10-CM | POA: Diagnosis not present

## 2018-09-21 DIAGNOSIS — J3089 Other allergic rhinitis: Secondary | ICD-10-CM | POA: Diagnosis not present

## 2018-09-28 DIAGNOSIS — J3089 Other allergic rhinitis: Secondary | ICD-10-CM | POA: Diagnosis not present

## 2018-09-28 DIAGNOSIS — J3081 Allergic rhinitis due to animal (cat) (dog) hair and dander: Secondary | ICD-10-CM | POA: Diagnosis not present

## 2018-09-28 DIAGNOSIS — J301 Allergic rhinitis due to pollen: Secondary | ICD-10-CM | POA: Diagnosis not present

## 2018-10-05 DIAGNOSIS — J3081 Allergic rhinitis due to animal (cat) (dog) hair and dander: Secondary | ICD-10-CM | POA: Diagnosis not present

## 2018-10-05 DIAGNOSIS — J301 Allergic rhinitis due to pollen: Secondary | ICD-10-CM | POA: Diagnosis not present

## 2018-10-05 DIAGNOSIS — J3089 Other allergic rhinitis: Secondary | ICD-10-CM | POA: Diagnosis not present

## 2018-10-12 DIAGNOSIS — J301 Allergic rhinitis due to pollen: Secondary | ICD-10-CM | POA: Diagnosis not present

## 2018-10-12 DIAGNOSIS — J3081 Allergic rhinitis due to animal (cat) (dog) hair and dander: Secondary | ICD-10-CM | POA: Diagnosis not present

## 2018-10-12 DIAGNOSIS — J3089 Other allergic rhinitis: Secondary | ICD-10-CM | POA: Diagnosis not present

## 2018-10-13 ENCOUNTER — Other Ambulatory Visit: Payer: Self-pay

## 2018-10-13 ENCOUNTER — Ambulatory Visit
Admission: RE | Admit: 2018-10-13 | Discharge: 2018-10-13 | Disposition: A | Discharge status: Home / Self Care | Payer: BLUE CROSS/BLUE SHIELD | Source: Ambulatory Visit | Attending: Internal Medicine | Admitting: Internal Medicine

## 2018-10-13 DIAGNOSIS — Z1231 Encounter for screening mammogram for malignant neoplasm of breast: Secondary | ICD-10-CM

## 2018-10-16 DIAGNOSIS — N9089 Other specified noninflammatory disorders of vulva and perineum: Secondary | ICD-10-CM | POA: Diagnosis not present

## 2018-10-16 DIAGNOSIS — N951 Menopausal and female climacteric states: Secondary | ICD-10-CM | POA: Diagnosis not present

## 2018-10-16 DIAGNOSIS — Z6820 Body mass index (BMI) 20.0-20.9, adult: Secondary | ICD-10-CM | POA: Diagnosis not present

## 2018-10-16 DIAGNOSIS — N898 Other specified noninflammatory disorders of vagina: Secondary | ICD-10-CM | POA: Diagnosis not present

## 2018-10-19 DIAGNOSIS — J3089 Other allergic rhinitis: Secondary | ICD-10-CM | POA: Diagnosis not present

## 2018-10-19 DIAGNOSIS — J301 Allergic rhinitis due to pollen: Secondary | ICD-10-CM | POA: Diagnosis not present

## 2018-10-19 DIAGNOSIS — J3081 Allergic rhinitis due to animal (cat) (dog) hair and dander: Secondary | ICD-10-CM | POA: Diagnosis not present

## 2018-10-26 DIAGNOSIS — J3081 Allergic rhinitis due to animal (cat) (dog) hair and dander: Secondary | ICD-10-CM | POA: Diagnosis not present

## 2018-10-26 DIAGNOSIS — J301 Allergic rhinitis due to pollen: Secondary | ICD-10-CM | POA: Diagnosis not present

## 2018-10-26 DIAGNOSIS — J3089 Other allergic rhinitis: Secondary | ICD-10-CM | POA: Diagnosis not present

## 2018-11-02 DIAGNOSIS — J3081 Allergic rhinitis due to animal (cat) (dog) hair and dander: Secondary | ICD-10-CM | POA: Diagnosis not present

## 2018-11-02 DIAGNOSIS — J301 Allergic rhinitis due to pollen: Secondary | ICD-10-CM | POA: Diagnosis not present

## 2018-11-02 DIAGNOSIS — J3089 Other allergic rhinitis: Secondary | ICD-10-CM | POA: Diagnosis not present

## 2018-11-09 DIAGNOSIS — J3089 Other allergic rhinitis: Secondary | ICD-10-CM | POA: Diagnosis not present

## 2018-11-09 DIAGNOSIS — J3081 Allergic rhinitis due to animal (cat) (dog) hair and dander: Secondary | ICD-10-CM | POA: Diagnosis not present

## 2018-11-09 DIAGNOSIS — J301 Allergic rhinitis due to pollen: Secondary | ICD-10-CM | POA: Diagnosis not present

## 2018-11-16 DIAGNOSIS — J301 Allergic rhinitis due to pollen: Secondary | ICD-10-CM | POA: Diagnosis not present

## 2018-11-16 DIAGNOSIS — J3081 Allergic rhinitis due to animal (cat) (dog) hair and dander: Secondary | ICD-10-CM | POA: Diagnosis not present

## 2018-11-16 DIAGNOSIS — J3089 Other allergic rhinitis: Secondary | ICD-10-CM | POA: Diagnosis not present

## 2018-11-17 DIAGNOSIS — J3089 Other allergic rhinitis: Secondary | ICD-10-CM | POA: Diagnosis not present

## 2018-11-23 DIAGNOSIS — J3089 Other allergic rhinitis: Secondary | ICD-10-CM | POA: Diagnosis not present

## 2018-11-23 DIAGNOSIS — J3081 Allergic rhinitis due to animal (cat) (dog) hair and dander: Secondary | ICD-10-CM | POA: Diagnosis not present

## 2018-11-23 DIAGNOSIS — J301 Allergic rhinitis due to pollen: Secondary | ICD-10-CM | POA: Diagnosis not present

## 2018-11-30 DIAGNOSIS — J3089 Other allergic rhinitis: Secondary | ICD-10-CM | POA: Diagnosis not present

## 2018-11-30 DIAGNOSIS — J301 Allergic rhinitis due to pollen: Secondary | ICD-10-CM | POA: Diagnosis not present

## 2018-12-06 DIAGNOSIS — J3081 Allergic rhinitis due to animal (cat) (dog) hair and dander: Secondary | ICD-10-CM | POA: Diagnosis not present

## 2018-12-06 DIAGNOSIS — J3089 Other allergic rhinitis: Secondary | ICD-10-CM | POA: Diagnosis not present

## 2018-12-06 DIAGNOSIS — J301 Allergic rhinitis due to pollen: Secondary | ICD-10-CM | POA: Diagnosis not present

## 2018-12-07 DIAGNOSIS — J3089 Other allergic rhinitis: Secondary | ICD-10-CM | POA: Diagnosis not present

## 2018-12-07 DIAGNOSIS — J301 Allergic rhinitis due to pollen: Secondary | ICD-10-CM | POA: Diagnosis not present

## 2018-12-07 DIAGNOSIS — J3081 Allergic rhinitis due to animal (cat) (dog) hair and dander: Secondary | ICD-10-CM | POA: Diagnosis not present

## 2018-12-13 DIAGNOSIS — J3081 Allergic rhinitis due to animal (cat) (dog) hair and dander: Secondary | ICD-10-CM | POA: Diagnosis not present

## 2018-12-13 DIAGNOSIS — J3089 Other allergic rhinitis: Secondary | ICD-10-CM | POA: Diagnosis not present

## 2018-12-13 DIAGNOSIS — J301 Allergic rhinitis due to pollen: Secondary | ICD-10-CM | POA: Diagnosis not present

## 2018-12-20 DIAGNOSIS — J3081 Allergic rhinitis due to animal (cat) (dog) hair and dander: Secondary | ICD-10-CM | POA: Diagnosis not present

## 2018-12-20 DIAGNOSIS — J301 Allergic rhinitis due to pollen: Secondary | ICD-10-CM | POA: Diagnosis not present

## 2018-12-20 DIAGNOSIS — J3089 Other allergic rhinitis: Secondary | ICD-10-CM | POA: Diagnosis not present

## 2018-12-22 DIAGNOSIS — Z23 Encounter for immunization: Secondary | ICD-10-CM | POA: Diagnosis not present

## 2018-12-27 DIAGNOSIS — J3089 Other allergic rhinitis: Secondary | ICD-10-CM | POA: Diagnosis not present

## 2018-12-27 DIAGNOSIS — J3081 Allergic rhinitis due to animal (cat) (dog) hair and dander: Secondary | ICD-10-CM | POA: Diagnosis not present

## 2018-12-27 DIAGNOSIS — J301 Allergic rhinitis due to pollen: Secondary | ICD-10-CM | POA: Diagnosis not present

## 2019-01-03 DIAGNOSIS — J3089 Other allergic rhinitis: Secondary | ICD-10-CM | POA: Diagnosis not present

## 2019-01-03 DIAGNOSIS — J301 Allergic rhinitis due to pollen: Secondary | ICD-10-CM | POA: Diagnosis not present

## 2019-01-03 DIAGNOSIS — J3081 Allergic rhinitis due to animal (cat) (dog) hair and dander: Secondary | ICD-10-CM | POA: Diagnosis not present

## 2019-01-10 DIAGNOSIS — J3089 Other allergic rhinitis: Secondary | ICD-10-CM | POA: Diagnosis not present

## 2019-01-10 DIAGNOSIS — J301 Allergic rhinitis due to pollen: Secondary | ICD-10-CM | POA: Diagnosis not present

## 2019-01-10 DIAGNOSIS — J3081 Allergic rhinitis due to animal (cat) (dog) hair and dander: Secondary | ICD-10-CM | POA: Diagnosis not present

## 2019-01-17 DIAGNOSIS — J301 Allergic rhinitis due to pollen: Secondary | ICD-10-CM | POA: Diagnosis not present

## 2019-01-17 DIAGNOSIS — J3089 Other allergic rhinitis: Secondary | ICD-10-CM | POA: Diagnosis not present

## 2019-01-17 DIAGNOSIS — J3081 Allergic rhinitis due to animal (cat) (dog) hair and dander: Secondary | ICD-10-CM | POA: Diagnosis not present

## 2019-01-24 DIAGNOSIS — J3081 Allergic rhinitis due to animal (cat) (dog) hair and dander: Secondary | ICD-10-CM | POA: Diagnosis not present

## 2019-01-24 DIAGNOSIS — J301 Allergic rhinitis due to pollen: Secondary | ICD-10-CM | POA: Diagnosis not present

## 2019-01-24 DIAGNOSIS — J3089 Other allergic rhinitis: Secondary | ICD-10-CM | POA: Diagnosis not present

## 2019-01-31 DIAGNOSIS — J3081 Allergic rhinitis due to animal (cat) (dog) hair and dander: Secondary | ICD-10-CM | POA: Diagnosis not present

## 2019-01-31 DIAGNOSIS — J301 Allergic rhinitis due to pollen: Secondary | ICD-10-CM | POA: Diagnosis not present

## 2019-01-31 DIAGNOSIS — J3089 Other allergic rhinitis: Secondary | ICD-10-CM | POA: Diagnosis not present

## 2019-02-07 DIAGNOSIS — J3081 Allergic rhinitis due to animal (cat) (dog) hair and dander: Secondary | ICD-10-CM | POA: Diagnosis not present

## 2019-02-07 DIAGNOSIS — J3089 Other allergic rhinitis: Secondary | ICD-10-CM | POA: Diagnosis not present

## 2019-02-07 DIAGNOSIS — J301 Allergic rhinitis due to pollen: Secondary | ICD-10-CM | POA: Diagnosis not present

## 2019-02-14 DIAGNOSIS — J3081 Allergic rhinitis due to animal (cat) (dog) hair and dander: Secondary | ICD-10-CM | POA: Diagnosis not present

## 2019-02-14 DIAGNOSIS — J3089 Other allergic rhinitis: Secondary | ICD-10-CM | POA: Diagnosis not present

## 2019-02-14 DIAGNOSIS — J301 Allergic rhinitis due to pollen: Secondary | ICD-10-CM | POA: Diagnosis not present

## 2019-02-21 DIAGNOSIS — J3081 Allergic rhinitis due to animal (cat) (dog) hair and dander: Secondary | ICD-10-CM | POA: Diagnosis not present

## 2019-02-21 DIAGNOSIS — J3089 Other allergic rhinitis: Secondary | ICD-10-CM | POA: Diagnosis not present

## 2019-02-21 DIAGNOSIS — J301 Allergic rhinitis due to pollen: Secondary | ICD-10-CM | POA: Diagnosis not present

## 2019-02-28 DIAGNOSIS — J301 Allergic rhinitis due to pollen: Secondary | ICD-10-CM | POA: Diagnosis not present

## 2019-02-28 DIAGNOSIS — J3081 Allergic rhinitis due to animal (cat) (dog) hair and dander: Secondary | ICD-10-CM | POA: Diagnosis not present

## 2019-02-28 DIAGNOSIS — J3089 Other allergic rhinitis: Secondary | ICD-10-CM | POA: Diagnosis not present

## 2019-03-07 DIAGNOSIS — J3081 Allergic rhinitis due to animal (cat) (dog) hair and dander: Secondary | ICD-10-CM | POA: Diagnosis not present

## 2019-03-07 DIAGNOSIS — J301 Allergic rhinitis due to pollen: Secondary | ICD-10-CM | POA: Diagnosis not present

## 2019-03-07 DIAGNOSIS — J3089 Other allergic rhinitis: Secondary | ICD-10-CM | POA: Diagnosis not present

## 2019-03-14 DIAGNOSIS — H6693 Otitis media, unspecified, bilateral: Secondary | ICD-10-CM | POA: Diagnosis not present

## 2019-03-14 DIAGNOSIS — Z20828 Contact with and (suspected) exposure to other viral communicable diseases: Secondary | ICD-10-CM | POA: Diagnosis not present

## 2019-03-18 DIAGNOSIS — Z20828 Contact with and (suspected) exposure to other viral communicable diseases: Secondary | ICD-10-CM | POA: Diagnosis not present

## 2019-03-18 DIAGNOSIS — R05 Cough: Secondary | ICD-10-CM | POA: Diagnosis not present

## 2019-04-11 DIAGNOSIS — J301 Allergic rhinitis due to pollen: Secondary | ICD-10-CM | POA: Diagnosis not present

## 2019-04-11 DIAGNOSIS — J3081 Allergic rhinitis due to animal (cat) (dog) hair and dander: Secondary | ICD-10-CM | POA: Diagnosis not present

## 2019-04-11 DIAGNOSIS — J3089 Other allergic rhinitis: Secondary | ICD-10-CM | POA: Diagnosis not present

## 2019-04-18 DIAGNOSIS — J301 Allergic rhinitis due to pollen: Secondary | ICD-10-CM | POA: Diagnosis not present

## 2019-04-18 DIAGNOSIS — J3089 Other allergic rhinitis: Secondary | ICD-10-CM | POA: Diagnosis not present

## 2019-04-18 DIAGNOSIS — J3081 Allergic rhinitis due to animal (cat) (dog) hair and dander: Secondary | ICD-10-CM | POA: Diagnosis not present

## 2019-04-25 DIAGNOSIS — J3089 Other allergic rhinitis: Secondary | ICD-10-CM | POA: Diagnosis not present

## 2019-04-25 DIAGNOSIS — J301 Allergic rhinitis due to pollen: Secondary | ICD-10-CM | POA: Diagnosis not present

## 2019-05-02 DIAGNOSIS — J3089 Other allergic rhinitis: Secondary | ICD-10-CM | POA: Diagnosis not present

## 2019-05-02 DIAGNOSIS — J301 Allergic rhinitis due to pollen: Secondary | ICD-10-CM | POA: Diagnosis not present

## 2019-05-02 DIAGNOSIS — J3081 Allergic rhinitis due to animal (cat) (dog) hair and dander: Secondary | ICD-10-CM | POA: Diagnosis not present

## 2019-05-09 DIAGNOSIS — J3081 Allergic rhinitis due to animal (cat) (dog) hair and dander: Secondary | ICD-10-CM | POA: Diagnosis not present

## 2019-05-09 DIAGNOSIS — J3089 Other allergic rhinitis: Secondary | ICD-10-CM | POA: Diagnosis not present

## 2019-05-09 DIAGNOSIS — J301 Allergic rhinitis due to pollen: Secondary | ICD-10-CM | POA: Diagnosis not present

## 2019-05-16 DIAGNOSIS — J3081 Allergic rhinitis due to animal (cat) (dog) hair and dander: Secondary | ICD-10-CM | POA: Diagnosis not present

## 2019-05-16 DIAGNOSIS — J301 Allergic rhinitis due to pollen: Secondary | ICD-10-CM | POA: Diagnosis not present

## 2019-05-16 DIAGNOSIS — J3089 Other allergic rhinitis: Secondary | ICD-10-CM | POA: Diagnosis not present

## 2019-05-23 DIAGNOSIS — J3081 Allergic rhinitis due to animal (cat) (dog) hair and dander: Secondary | ICD-10-CM | POA: Diagnosis not present

## 2019-05-23 DIAGNOSIS — J3089 Other allergic rhinitis: Secondary | ICD-10-CM | POA: Diagnosis not present

## 2019-05-23 DIAGNOSIS — J301 Allergic rhinitis due to pollen: Secondary | ICD-10-CM | POA: Diagnosis not present

## 2019-05-30 DIAGNOSIS — J3089 Other allergic rhinitis: Secondary | ICD-10-CM | POA: Diagnosis not present

## 2019-05-30 DIAGNOSIS — J3081 Allergic rhinitis due to animal (cat) (dog) hair and dander: Secondary | ICD-10-CM | POA: Diagnosis not present

## 2019-05-30 DIAGNOSIS — J301 Allergic rhinitis due to pollen: Secondary | ICD-10-CM | POA: Diagnosis not present

## 2019-05-31 DIAGNOSIS — J3089 Other allergic rhinitis: Secondary | ICD-10-CM | POA: Diagnosis not present

## 2019-06-06 DIAGNOSIS — J3081 Allergic rhinitis due to animal (cat) (dog) hair and dander: Secondary | ICD-10-CM | POA: Diagnosis not present

## 2019-06-06 DIAGNOSIS — J301 Allergic rhinitis due to pollen: Secondary | ICD-10-CM | POA: Diagnosis not present

## 2019-06-06 DIAGNOSIS — J3089 Other allergic rhinitis: Secondary | ICD-10-CM | POA: Diagnosis not present

## 2019-06-13 DIAGNOSIS — J3081 Allergic rhinitis due to animal (cat) (dog) hair and dander: Secondary | ICD-10-CM | POA: Diagnosis not present

## 2019-06-13 DIAGNOSIS — J301 Allergic rhinitis due to pollen: Secondary | ICD-10-CM | POA: Diagnosis not present

## 2019-06-13 DIAGNOSIS — J3089 Other allergic rhinitis: Secondary | ICD-10-CM | POA: Diagnosis not present

## 2019-06-20 DIAGNOSIS — J301 Allergic rhinitis due to pollen: Secondary | ICD-10-CM | POA: Diagnosis not present

## 2019-06-20 DIAGNOSIS — J3089 Other allergic rhinitis: Secondary | ICD-10-CM | POA: Diagnosis not present

## 2019-06-20 DIAGNOSIS — J3081 Allergic rhinitis due to animal (cat) (dog) hair and dander: Secondary | ICD-10-CM | POA: Diagnosis not present

## 2019-06-27 DIAGNOSIS — J3081 Allergic rhinitis due to animal (cat) (dog) hair and dander: Secondary | ICD-10-CM | POA: Diagnosis not present

## 2019-06-27 DIAGNOSIS — J301 Allergic rhinitis due to pollen: Secondary | ICD-10-CM | POA: Diagnosis not present

## 2019-06-27 DIAGNOSIS — J3089 Other allergic rhinitis: Secondary | ICD-10-CM | POA: Diagnosis not present

## 2019-07-04 DIAGNOSIS — J301 Allergic rhinitis due to pollen: Secondary | ICD-10-CM | POA: Diagnosis not present

## 2019-07-04 DIAGNOSIS — J3081 Allergic rhinitis due to animal (cat) (dog) hair and dander: Secondary | ICD-10-CM | POA: Diagnosis not present

## 2019-07-04 DIAGNOSIS — J3089 Other allergic rhinitis: Secondary | ICD-10-CM | POA: Diagnosis not present

## 2019-07-11 DIAGNOSIS — J301 Allergic rhinitis due to pollen: Secondary | ICD-10-CM | POA: Diagnosis not present

## 2019-07-11 DIAGNOSIS — J3089 Other allergic rhinitis: Secondary | ICD-10-CM | POA: Diagnosis not present

## 2019-07-11 DIAGNOSIS — J3081 Allergic rhinitis due to animal (cat) (dog) hair and dander: Secondary | ICD-10-CM | POA: Diagnosis not present

## 2019-07-18 DIAGNOSIS — J3089 Other allergic rhinitis: Secondary | ICD-10-CM | POA: Diagnosis not present

## 2019-07-18 DIAGNOSIS — J301 Allergic rhinitis due to pollen: Secondary | ICD-10-CM | POA: Diagnosis not present

## 2019-07-18 DIAGNOSIS — J3081 Allergic rhinitis due to animal (cat) (dog) hair and dander: Secondary | ICD-10-CM | POA: Diagnosis not present

## 2019-07-25 DIAGNOSIS — J3089 Other allergic rhinitis: Secondary | ICD-10-CM | POA: Diagnosis not present

## 2019-07-25 DIAGNOSIS — J301 Allergic rhinitis due to pollen: Secondary | ICD-10-CM | POA: Diagnosis not present

## 2019-07-25 DIAGNOSIS — J3081 Allergic rhinitis due to animal (cat) (dog) hair and dander: Secondary | ICD-10-CM | POA: Diagnosis not present

## 2019-08-01 DIAGNOSIS — J3081 Allergic rhinitis due to animal (cat) (dog) hair and dander: Secondary | ICD-10-CM | POA: Diagnosis not present

## 2019-08-01 DIAGNOSIS — J3089 Other allergic rhinitis: Secondary | ICD-10-CM | POA: Diagnosis not present

## 2019-08-01 DIAGNOSIS — J301 Allergic rhinitis due to pollen: Secondary | ICD-10-CM | POA: Diagnosis not present

## 2019-08-08 DIAGNOSIS — J3089 Other allergic rhinitis: Secondary | ICD-10-CM | POA: Diagnosis not present

## 2019-08-08 DIAGNOSIS — J3081 Allergic rhinitis due to animal (cat) (dog) hair and dander: Secondary | ICD-10-CM | POA: Diagnosis not present

## 2019-08-08 DIAGNOSIS — J301 Allergic rhinitis due to pollen: Secondary | ICD-10-CM | POA: Diagnosis not present

## 2019-08-15 DIAGNOSIS — J301 Allergic rhinitis due to pollen: Secondary | ICD-10-CM | POA: Diagnosis not present

## 2019-08-15 DIAGNOSIS — J3081 Allergic rhinitis due to animal (cat) (dog) hair and dander: Secondary | ICD-10-CM | POA: Diagnosis not present

## 2019-08-15 DIAGNOSIS — J3089 Other allergic rhinitis: Secondary | ICD-10-CM | POA: Diagnosis not present

## 2019-08-16 DIAGNOSIS — E559 Vitamin D deficiency, unspecified: Secondary | ICD-10-CM | POA: Diagnosis not present

## 2019-08-16 DIAGNOSIS — M85859 Other specified disorders of bone density and structure, unspecified thigh: Secondary | ICD-10-CM | POA: Diagnosis not present

## 2019-08-16 DIAGNOSIS — Z0000 Encounter for general adult medical examination without abnormal findings: Secondary | ICD-10-CM | POA: Diagnosis not present

## 2019-08-16 DIAGNOSIS — Z13220 Encounter for screening for lipoid disorders: Secondary | ICD-10-CM | POA: Diagnosis not present

## 2019-08-16 DIAGNOSIS — Z8639 Personal history of other endocrine, nutritional and metabolic disease: Secondary | ICD-10-CM | POA: Diagnosis not present

## 2019-08-16 LAB — LIPID PANEL
Cholesterol: 225 — AB (ref 0–200)
HDL: 65 (ref 35–70)
LDL Cholesterol: 141
LDl/HDL Ratio: 2.2
Triglycerides: 97 (ref 40–160)

## 2019-08-16 LAB — CBC AND DIFFERENTIAL
HCT: 40 (ref 36–46)
Hemoglobin: 13.4 (ref 12.0–16.0)
Platelets: 292 (ref 150–399)
WBC: 9

## 2019-08-16 LAB — CBC: RBC: 4.69 (ref 3.87–5.11)

## 2019-08-16 LAB — HEPATIC FUNCTION PANEL
ALT: 19 (ref 7–35)
AST: 22 (ref 13–35)
Alkaline Phosphatase: 52 (ref 25–125)
Bilirubin, Total: 0.6

## 2019-08-16 LAB — VITAMIN D 25 HYDROXY (VIT D DEFICIENCY, FRACTURES): Vit D, 25-Hydroxy: 59

## 2019-08-16 LAB — TSH: TSH: 2.27 (ref 0.41–5.90)

## 2019-08-16 LAB — COMPREHENSIVE METABOLIC PANEL
Albumin: 3.9 (ref 3.5–5.0)
Calcium: 8.8 (ref 8.7–10.7)
GFR calc Af Amer: 92.95
GFR calc non Af Amer: 76.82
Globulin: 3.4

## 2019-08-16 LAB — BASIC METABOLIC PANEL
BUN: 15 (ref 4–21)
Chloride: 106 (ref 99–108)
Creatinine: 0.8 (ref 0.5–1.1)
Glucose: 84
Potassium: 4.2 (ref 3.4–5.3)
Sodium: 142 (ref 137–147)

## 2019-08-22 DIAGNOSIS — Z1212 Encounter for screening for malignant neoplasm of rectum: Secondary | ICD-10-CM | POA: Diagnosis not present

## 2019-08-22 DIAGNOSIS — J3089 Other allergic rhinitis: Secondary | ICD-10-CM | POA: Diagnosis not present

## 2019-08-22 DIAGNOSIS — J301 Allergic rhinitis due to pollen: Secondary | ICD-10-CM | POA: Diagnosis not present

## 2019-08-22 DIAGNOSIS — Z01419 Encounter for gynecological examination (general) (routine) without abnormal findings: Secondary | ICD-10-CM | POA: Diagnosis not present

## 2019-08-22 DIAGNOSIS — Z0000 Encounter for general adult medical examination without abnormal findings: Secondary | ICD-10-CM | POA: Diagnosis not present

## 2019-08-22 DIAGNOSIS — J3081 Allergic rhinitis due to animal (cat) (dog) hair and dander: Secondary | ICD-10-CM | POA: Diagnosis not present

## 2019-08-29 DIAGNOSIS — J3081 Allergic rhinitis due to animal (cat) (dog) hair and dander: Secondary | ICD-10-CM | POA: Diagnosis not present

## 2019-08-29 DIAGNOSIS — J301 Allergic rhinitis due to pollen: Secondary | ICD-10-CM | POA: Diagnosis not present

## 2019-08-29 DIAGNOSIS — J3089 Other allergic rhinitis: Secondary | ICD-10-CM | POA: Diagnosis not present

## 2019-09-05 DIAGNOSIS — J301 Allergic rhinitis due to pollen: Secondary | ICD-10-CM | POA: Diagnosis not present

## 2019-09-05 DIAGNOSIS — J3081 Allergic rhinitis due to animal (cat) (dog) hair and dander: Secondary | ICD-10-CM | POA: Diagnosis not present

## 2019-09-05 DIAGNOSIS — J3089 Other allergic rhinitis: Secondary | ICD-10-CM | POA: Diagnosis not present

## 2019-09-07 ENCOUNTER — Other Ambulatory Visit: Payer: Self-pay | Admitting: Internal Medicine

## 2019-09-07 DIAGNOSIS — Z1231 Encounter for screening mammogram for malignant neoplasm of breast: Secondary | ICD-10-CM

## 2019-09-12 DIAGNOSIS — J301 Allergic rhinitis due to pollen: Secondary | ICD-10-CM | POA: Diagnosis not present

## 2019-09-12 DIAGNOSIS — J3081 Allergic rhinitis due to animal (cat) (dog) hair and dander: Secondary | ICD-10-CM | POA: Diagnosis not present

## 2019-09-12 DIAGNOSIS — J3089 Other allergic rhinitis: Secondary | ICD-10-CM | POA: Diagnosis not present

## 2019-09-19 DIAGNOSIS — J301 Allergic rhinitis due to pollen: Secondary | ICD-10-CM | POA: Diagnosis not present

## 2019-09-19 DIAGNOSIS — J3081 Allergic rhinitis due to animal (cat) (dog) hair and dander: Secondary | ICD-10-CM | POA: Diagnosis not present

## 2019-09-19 DIAGNOSIS — J3089 Other allergic rhinitis: Secondary | ICD-10-CM | POA: Diagnosis not present

## 2019-09-26 DIAGNOSIS — J3081 Allergic rhinitis due to animal (cat) (dog) hair and dander: Secondary | ICD-10-CM | POA: Diagnosis not present

## 2019-09-26 DIAGNOSIS — J3089 Other allergic rhinitis: Secondary | ICD-10-CM | POA: Diagnosis not present

## 2019-09-26 DIAGNOSIS — J301 Allergic rhinitis due to pollen: Secondary | ICD-10-CM | POA: Diagnosis not present

## 2019-10-03 DIAGNOSIS — J301 Allergic rhinitis due to pollen: Secondary | ICD-10-CM | POA: Diagnosis not present

## 2019-10-03 DIAGNOSIS — J3081 Allergic rhinitis due to animal (cat) (dog) hair and dander: Secondary | ICD-10-CM | POA: Diagnosis not present

## 2019-10-03 DIAGNOSIS — J3089 Other allergic rhinitis: Secondary | ICD-10-CM | POA: Diagnosis not present

## 2019-10-05 DIAGNOSIS — J3089 Other allergic rhinitis: Secondary | ICD-10-CM | POA: Diagnosis not present

## 2019-10-05 DIAGNOSIS — J301 Allergic rhinitis due to pollen: Secondary | ICD-10-CM | POA: Diagnosis not present

## 2019-10-05 DIAGNOSIS — J3081 Allergic rhinitis due to animal (cat) (dog) hair and dander: Secondary | ICD-10-CM | POA: Diagnosis not present

## 2019-10-10 DIAGNOSIS — J3081 Allergic rhinitis due to animal (cat) (dog) hair and dander: Secondary | ICD-10-CM | POA: Diagnosis not present

## 2019-10-10 DIAGNOSIS — J301 Allergic rhinitis due to pollen: Secondary | ICD-10-CM | POA: Diagnosis not present

## 2019-10-10 DIAGNOSIS — J3089 Other allergic rhinitis: Secondary | ICD-10-CM | POA: Diagnosis not present

## 2019-10-17 DIAGNOSIS — J3081 Allergic rhinitis due to animal (cat) (dog) hair and dander: Secondary | ICD-10-CM | POA: Diagnosis not present

## 2019-10-17 DIAGNOSIS — J301 Allergic rhinitis due to pollen: Secondary | ICD-10-CM | POA: Diagnosis not present

## 2019-10-17 DIAGNOSIS — J3089 Other allergic rhinitis: Secondary | ICD-10-CM | POA: Diagnosis not present

## 2019-10-19 ENCOUNTER — Ambulatory Visit
Admission: RE | Admit: 2019-10-19 | Discharge: 2019-10-19 | Disposition: A | Discharge status: Home / Self Care | Payer: BC Managed Care – PPO | Source: Ambulatory Visit | Attending: Internal Medicine | Admitting: Internal Medicine

## 2019-10-19 ENCOUNTER — Other Ambulatory Visit: Payer: Self-pay

## 2019-10-19 DIAGNOSIS — Z1231 Encounter for screening mammogram for malignant neoplasm of breast: Secondary | ICD-10-CM

## 2019-10-19 LAB — HM MAMMOGRAPHY

## 2019-10-24 ENCOUNTER — Other Ambulatory Visit: Payer: Self-pay | Admitting: Internal Medicine

## 2019-10-24 DIAGNOSIS — J3089 Other allergic rhinitis: Secondary | ICD-10-CM | POA: Diagnosis not present

## 2019-10-24 DIAGNOSIS — J301 Allergic rhinitis due to pollen: Secondary | ICD-10-CM | POA: Diagnosis not present

## 2019-10-24 DIAGNOSIS — J3081 Allergic rhinitis due to animal (cat) (dog) hair and dander: Secondary | ICD-10-CM | POA: Diagnosis not present

## 2019-10-24 DIAGNOSIS — R928 Other abnormal and inconclusive findings on diagnostic imaging of breast: Secondary | ICD-10-CM

## 2019-10-31 DIAGNOSIS — J301 Allergic rhinitis due to pollen: Secondary | ICD-10-CM | POA: Diagnosis not present

## 2019-10-31 DIAGNOSIS — J3089 Other allergic rhinitis: Secondary | ICD-10-CM | POA: Diagnosis not present

## 2019-10-31 DIAGNOSIS — J3081 Allergic rhinitis due to animal (cat) (dog) hair and dander: Secondary | ICD-10-CM | POA: Diagnosis not present

## 2019-11-02 ENCOUNTER — Ambulatory Visit
Admission: RE | Admit: 2019-11-02 | Discharge: 2019-11-02 | Disposition: A | Discharge status: Home / Self Care | Payer: BC Managed Care – PPO | Source: Ambulatory Visit | Attending: Internal Medicine | Admitting: Internal Medicine

## 2019-11-02 ENCOUNTER — Ambulatory Visit: Payer: BC Managed Care – PPO

## 2019-11-02 ENCOUNTER — Other Ambulatory Visit: Payer: Self-pay

## 2019-11-02 DIAGNOSIS — N6321 Unspecified lump in the left breast, upper outer quadrant: Secondary | ICD-10-CM | POA: Diagnosis not present

## 2019-11-02 DIAGNOSIS — R928 Other abnormal and inconclusive findings on diagnostic imaging of breast: Secondary | ICD-10-CM

## 2019-11-02 DIAGNOSIS — N6012 Diffuse cystic mastopathy of left breast: Secondary | ICD-10-CM | POA: Diagnosis not present

## 2019-11-02 DIAGNOSIS — R922 Inconclusive mammogram: Secondary | ICD-10-CM | POA: Diagnosis not present

## 2019-11-02 LAB — HM MAMMOGRAPHY

## 2019-11-07 DIAGNOSIS — J3081 Allergic rhinitis due to animal (cat) (dog) hair and dander: Secondary | ICD-10-CM | POA: Diagnosis not present

## 2019-11-07 DIAGNOSIS — J3089 Other allergic rhinitis: Secondary | ICD-10-CM | POA: Diagnosis not present

## 2019-11-07 DIAGNOSIS — J301 Allergic rhinitis due to pollen: Secondary | ICD-10-CM | POA: Diagnosis not present

## 2019-11-14 DIAGNOSIS — J301 Allergic rhinitis due to pollen: Secondary | ICD-10-CM | POA: Diagnosis not present

## 2019-11-14 DIAGNOSIS — J3081 Allergic rhinitis due to animal (cat) (dog) hair and dander: Secondary | ICD-10-CM | POA: Diagnosis not present

## 2019-11-14 DIAGNOSIS — J3089 Other allergic rhinitis: Secondary | ICD-10-CM | POA: Diagnosis not present

## 2019-11-21 DIAGNOSIS — J3089 Other allergic rhinitis: Secondary | ICD-10-CM | POA: Diagnosis not present

## 2019-11-21 DIAGNOSIS — J3081 Allergic rhinitis due to animal (cat) (dog) hair and dander: Secondary | ICD-10-CM | POA: Diagnosis not present

## 2019-11-21 DIAGNOSIS — J301 Allergic rhinitis due to pollen: Secondary | ICD-10-CM | POA: Diagnosis not present

## 2019-11-28 DIAGNOSIS — J3089 Other allergic rhinitis: Secondary | ICD-10-CM | POA: Diagnosis not present

## 2019-11-28 DIAGNOSIS — J3081 Allergic rhinitis due to animal (cat) (dog) hair and dander: Secondary | ICD-10-CM | POA: Diagnosis not present

## 2019-11-28 DIAGNOSIS — J301 Allergic rhinitis due to pollen: Secondary | ICD-10-CM | POA: Diagnosis not present

## 2019-12-05 DIAGNOSIS — J3081 Allergic rhinitis due to animal (cat) (dog) hair and dander: Secondary | ICD-10-CM | POA: Diagnosis not present

## 2019-12-05 DIAGNOSIS — J301 Allergic rhinitis due to pollen: Secondary | ICD-10-CM | POA: Diagnosis not present

## 2019-12-05 DIAGNOSIS — J3089 Other allergic rhinitis: Secondary | ICD-10-CM | POA: Diagnosis not present

## 2019-12-12 DIAGNOSIS — J301 Allergic rhinitis due to pollen: Secondary | ICD-10-CM | POA: Diagnosis not present

## 2019-12-12 DIAGNOSIS — J3081 Allergic rhinitis due to animal (cat) (dog) hair and dander: Secondary | ICD-10-CM | POA: Diagnosis not present

## 2019-12-12 DIAGNOSIS — J3089 Other allergic rhinitis: Secondary | ICD-10-CM | POA: Diagnosis not present

## 2019-12-19 DIAGNOSIS — J3089 Other allergic rhinitis: Secondary | ICD-10-CM | POA: Diagnosis not present

## 2019-12-19 DIAGNOSIS — J301 Allergic rhinitis due to pollen: Secondary | ICD-10-CM | POA: Diagnosis not present

## 2019-12-19 DIAGNOSIS — J3081 Allergic rhinitis due to animal (cat) (dog) hair and dander: Secondary | ICD-10-CM | POA: Diagnosis not present

## 2019-12-25 DIAGNOSIS — N9089 Other specified noninflammatory disorders of vulva and perineum: Secondary | ICD-10-CM | POA: Diagnosis not present

## 2019-12-25 DIAGNOSIS — N951 Menopausal and female climacteric states: Secondary | ICD-10-CM | POA: Diagnosis not present

## 2019-12-25 DIAGNOSIS — Z7185 Encounter for immunization safety counseling: Secondary | ICD-10-CM | POA: Diagnosis not present

## 2019-12-25 DIAGNOSIS — R635 Abnormal weight gain: Secondary | ICD-10-CM | POA: Diagnosis not present

## 2019-12-26 DIAGNOSIS — J3081 Allergic rhinitis due to animal (cat) (dog) hair and dander: Secondary | ICD-10-CM | POA: Diagnosis not present

## 2019-12-26 DIAGNOSIS — J301 Allergic rhinitis due to pollen: Secondary | ICD-10-CM | POA: Diagnosis not present

## 2019-12-26 DIAGNOSIS — J3089 Other allergic rhinitis: Secondary | ICD-10-CM | POA: Diagnosis not present

## 2019-12-28 DIAGNOSIS — Z23 Encounter for immunization: Secondary | ICD-10-CM | POA: Diagnosis not present

## 2020-01-02 DIAGNOSIS — J301 Allergic rhinitis due to pollen: Secondary | ICD-10-CM | POA: Diagnosis not present

## 2020-01-02 DIAGNOSIS — J3081 Allergic rhinitis due to animal (cat) (dog) hair and dander: Secondary | ICD-10-CM | POA: Diagnosis not present

## 2020-01-02 DIAGNOSIS — J3089 Other allergic rhinitis: Secondary | ICD-10-CM | POA: Diagnosis not present

## 2020-01-09 DIAGNOSIS — J3089 Other allergic rhinitis: Secondary | ICD-10-CM | POA: Diagnosis not present

## 2020-01-09 DIAGNOSIS — J3081 Allergic rhinitis due to animal (cat) (dog) hair and dander: Secondary | ICD-10-CM | POA: Diagnosis not present

## 2020-01-09 DIAGNOSIS — J301 Allergic rhinitis due to pollen: Secondary | ICD-10-CM | POA: Diagnosis not present

## 2020-01-10 DIAGNOSIS — J301 Allergic rhinitis due to pollen: Secondary | ICD-10-CM | POA: Diagnosis not present

## 2020-01-10 DIAGNOSIS — J3081 Allergic rhinitis due to animal (cat) (dog) hair and dander: Secondary | ICD-10-CM | POA: Diagnosis not present

## 2020-01-11 DIAGNOSIS — J3089 Other allergic rhinitis: Secondary | ICD-10-CM | POA: Diagnosis not present

## 2020-01-16 DIAGNOSIS — J3089 Other allergic rhinitis: Secondary | ICD-10-CM | POA: Diagnosis not present

## 2020-01-16 DIAGNOSIS — J301 Allergic rhinitis due to pollen: Secondary | ICD-10-CM | POA: Diagnosis not present

## 2020-01-16 DIAGNOSIS — J3081 Allergic rhinitis due to animal (cat) (dog) hair and dander: Secondary | ICD-10-CM | POA: Diagnosis not present

## 2020-01-23 DIAGNOSIS — J301 Allergic rhinitis due to pollen: Secondary | ICD-10-CM | POA: Diagnosis not present

## 2020-01-23 DIAGNOSIS — J3081 Allergic rhinitis due to animal (cat) (dog) hair and dander: Secondary | ICD-10-CM | POA: Diagnosis not present

## 2020-01-23 DIAGNOSIS — J3089 Other allergic rhinitis: Secondary | ICD-10-CM | POA: Diagnosis not present

## 2020-01-30 DIAGNOSIS — J3081 Allergic rhinitis due to animal (cat) (dog) hair and dander: Secondary | ICD-10-CM | POA: Diagnosis not present

## 2020-01-30 DIAGNOSIS — J301 Allergic rhinitis due to pollen: Secondary | ICD-10-CM | POA: Diagnosis not present

## 2020-01-30 DIAGNOSIS — J3089 Other allergic rhinitis: Secondary | ICD-10-CM | POA: Diagnosis not present

## 2020-02-06 DIAGNOSIS — J301 Allergic rhinitis due to pollen: Secondary | ICD-10-CM | POA: Diagnosis not present

## 2020-02-06 DIAGNOSIS — J3081 Allergic rhinitis due to animal (cat) (dog) hair and dander: Secondary | ICD-10-CM | POA: Diagnosis not present

## 2020-02-06 DIAGNOSIS — J3089 Other allergic rhinitis: Secondary | ICD-10-CM | POA: Diagnosis not present

## 2020-02-13 DIAGNOSIS — J3089 Other allergic rhinitis: Secondary | ICD-10-CM | POA: Diagnosis not present

## 2020-02-13 DIAGNOSIS — J3081 Allergic rhinitis due to animal (cat) (dog) hair and dander: Secondary | ICD-10-CM | POA: Diagnosis not present

## 2020-02-13 DIAGNOSIS — J301 Allergic rhinitis due to pollen: Secondary | ICD-10-CM | POA: Diagnosis not present

## 2020-09-01 ENCOUNTER — Ambulatory Visit: Payer: No Typology Code available for payment source | Admitting: Medical-Surgical

## 2020-09-01 ENCOUNTER — Other Ambulatory Visit: Payer: Self-pay

## 2020-09-01 ENCOUNTER — Encounter: Payer: Self-pay | Admitting: Medical-Surgical

## 2020-09-01 VITALS — BP 124/69 | HR 60 | Temp 98.4°F | Ht 63.0 in | Wt 124.8 lb

## 2020-09-01 DIAGNOSIS — Z7689 Persons encountering health services in other specified circumstances: Secondary | ICD-10-CM

## 2020-09-01 DIAGNOSIS — Z23 Encounter for immunization: Secondary | ICD-10-CM

## 2020-09-01 DIAGNOSIS — Z0000 Encounter for general adult medical examination without abnormal findings: Secondary | ICD-10-CM

## 2020-09-01 DIAGNOSIS — E559 Vitamin D deficiency, unspecified: Secondary | ICD-10-CM

## 2020-09-01 DIAGNOSIS — R21 Rash and other nonspecific skin eruption: Secondary | ICD-10-CM

## 2020-09-01 DIAGNOSIS — Z13820 Encounter for screening for osteoporosis: Secondary | ICD-10-CM

## 2020-09-01 DIAGNOSIS — Z1329 Encounter for screening for other suspected endocrine disorder: Secondary | ICD-10-CM

## 2020-09-01 DIAGNOSIS — Z114 Encounter for screening for human immunodeficiency virus [HIV]: Secondary | ICD-10-CM

## 2020-09-01 DIAGNOSIS — Z1159 Encounter for screening for other viral diseases: Secondary | ICD-10-CM

## 2020-09-01 DIAGNOSIS — R635 Abnormal weight gain: Secondary | ICD-10-CM

## 2020-09-01 NOTE — Patient Instructions (Addendum)
Preventive Care 68-64 Years Old, Female Preventive care refers to lifestyle choices and visits with your health care provider that can promote health and wellness. This includes: A yearly physical exam. This is also called an annual wellness visit. Regular dental and eye exams. Immunizations. Screening for certain conditions. Healthy lifestyle choices, such as: Eating a healthy diet. Getting regular exercise. Not using drugs or products that contain nicotine and tobacco. Limiting alcohol use. What can I expect for my preventive care visit? Physical exam Your health care provider will check your: Height and weight. These may be used to calculate your BMI (body mass index). BMI is a measurement that tells if you are at a healthy weight. Heart rate and blood pressure. Body temperature. Skin for abnormal spots. Counseling Your health care provider may ask you questions about your: Past medical problems. Family's medical history. Alcohol, tobacco, and drug use. Emotional well-being. Home life and relationship well-being. Sexual activity. Diet, exercise, and sleep habits. Work and work Statistician. Access to firearms. Method of birth control. Menstrual cycle. Pregnancy history. What immunizations do I need?  Vaccines are usually given at various ages, according to a schedule. Your health care provider will recommend vaccines for you based on your age, medicalhistory, and lifestyle or other factors, such as travel or where you work. What tests do I need? Blood tests Lipid and cholesterol levels. These may be checked every 5 years, or more often if you are over 37 years old. Hepatitis C test. Hepatitis B test. Screening Lung cancer screening. You may have this screening every year starting at age 30 if you have a 30-pack-year history of smoking and currently smoke or have quit within the past 15 years. Colorectal cancer screening. All adults should have this screening starting at  age 23 and continuing until age 3. Your health care provider may recommend screening at age 88 if you are at increased risk. You will have tests every 1-10 years, depending on your results and the type of screening test. Diabetes screening. This is done by checking your blood sugar (glucose) after you have not eaten for a while (fasting). You may have this done every 1-3 years. Mammogram. This may be done every 1-2 years. Talk with your health care provider about when you should start having regular mammograms. This may depend on whether you have a family history of breast cancer. BRCA-related cancer screening. This may be done if you have a family history of breast, ovarian, tubal, or peritoneal cancers. Pelvic exam and Pap test. This may be done every 3 years starting at age 79. Starting at age 54, this may be done every 5 years if you have a Pap test in combination with an HPV test. Other tests STD (sexually transmitted disease) testing, if you are at risk. Bone density scan. This is done to screen for osteoporosis. You may have this scan if you are at high risk for osteoporosis. Talk with your health care provider about your test results, treatment options,and if necessary, the need for more tests. Follow these instructions at home: Eating and drinking  Eat a diet that includes fresh fruits and vegetables, whole grains, lean protein, and low-fat dairy products. Take vitamin and mineral supplements as recommended by your health care provider. Do not drink alcohol if: Your health care provider tells you not to drink. You are pregnant, may be pregnant, or are planning to become pregnant. If you drink alcohol: Limit how much you have to 0-1 drink a day. Be aware  of how much alcohol is in your drink. In the [place] one drink equals one 12 oz bottle of beer (355 mL), one 5 oz glass of wine (148 mL), or one 1 oz glass of hard liquor (44 mL).  Lifestyle Take daily care of your teeth and  gums. Brush your teeth every morning and night with fluoride toothpaste. Floss one time each day. Stay active. Exercise for at least 30 minutes 5 or more days each week. Do not use any products that contain nicotine or tobacco, such as cigarettes, e-cigarettes, and chewing tobacco. If you need help quitting, ask your health care provider. Do not use drugs. If you are sexually active, practice safe sex. Use a condom or other form of protection to prevent STIs (sexually transmitted infections). If you do not wish to become pregnant, use a form of birth control. If you plan to become pregnant, see your health care provider for a prepregnancy visit. If told by your health care provider, take low-dose aspirin daily starting at age 57. Find healthy ways to cope with stress, such as: Meditation, yoga, or listening to music. Journaling. Talking to a trusted person. Spending time with friends and family. Safety Always wear your seat belt while driving or riding in a vehicle. Do not drive: If you have been drinking alcohol. Do not ride with someone who has been drinking. When you are tired or distracted. While texting. Wear a helmet and other protective equipment during sports activities. If you have firearms in your house, make sure you follow all gun safety procedures. What's next? Visit your health care provider once a year for an annual wellness visit. Ask your health care provider how often you should have your eyes and teeth checked. Stay up to date on all vaccines. This information is not intended to replace advice given to you by your health care provider. Make sure you discuss any questions you have with your healthcare provider. Document Revised: 12/11/2019 Document Reviewed: 11/17/2017 Elsevier Patient Education  2022 [place].   Recombinant Zoster (Shingles) Vaccine: What You Need to Know 1. Why get vaccinated? Recombinant zoster (shingles) vaccine can prevent  shingles. Shingles (also called herpes zoster, or just zoster) is a painful skin rash, usually with blisters. In addition to the rash, shingles can cause fever, headache, chills, or upset stomach. More rarely, shingles can lead to pneumonia, hearingproblems, blindness, brain inflammation (encephalitis), or death. The most common complication of shingles is long-term nerve pain called postherpetic neuralgia (PHN). PHN occurs in the areas where the shingles rash was, even after the rash clears up. It can last for months or years after therash goes away. The pain from PHN can be severe and debilitating. About 10 to 18% of people who get shingles will experience PHN. The risk of PHN increases with age. An older adult with shingles is more likely to develop PHN and have longer lasting and more severe pain than a younger person withshingles. Shingles is caused by the varicella zoster virus, the same virus that causes chickenpox. After you have chickenpox, the virus stays in your body and can cause shingles later in life. Shingles cannot be passed from one person to another, but the virus that causes shingles can spread and cause chickenpox insomeone who had never had chickenpox or received chickenpox vaccine. 2. Recombinant shingles vaccine Recombinant shingles vaccine provides strong protection against shingles. Bypreventing shingles, recombinant shingles vaccine also protects against PHN. Recombinant shingles vaccine is the preferred vaccine for the prevention of shingles.  However, a different vaccine, live shingles vaccine, may be used in somecircumstances. The recombinant shingles vaccine is recommended for adults 50 years and older without serious immune problems. It is given as a two-dose series. This vaccine is also recommended for people who have already gotten another type of shingles vaccine, the live shingles vaccine. There is no live virus inthis vaccine. Shingles vaccine may be given at the same  time as other vaccines. 3. Talk with your health care provider Tell your vaccine provider if the person getting the vaccine: Has had an allergic reaction after a previous dose of recombinant shingles vaccine, or has any severe, life-threatening allergies. Is pregnant or breastfeeding. Is currently experiencing an episode of shingles. In some cases, your health care provider may decide to postpone shinglesvaccination to a future visit. People with minor illnesses, such as a cold, may be vaccinated. People who are moderately or severely ill should usually wait until they recover beforegetting recombinant shingles vaccine. Your health care provider can give you more information. 4. Risks of a vaccine reaction A sore arm with mild or moderate pain is very common after recombinant shingles vaccine, affecting about 80% of vaccinated people. Redness and swelling can also happen at the site of the injection. Tiredness, muscle pain, headache, shivering, fever, stomach pain, and nausea happen after vaccination in more than half of people who receive recombinant shingles vaccine. In clinical trials, about 1 out of 6 people who got recombinant zoster vaccine experienced side effects that prevented them from doing regular activities.Symptoms usually went away on their own in 2 to 3 days. You should still get the second dose of recombinant zoster vaccine even if youhad one of these reactions after the first dose. People sometimes faint after medical procedures, including vaccination. Tellyour provider if you feel dizzy or have vision changes or ringing in the ears. As with any medicine, there is a very remote chance of a vaccine causing asevere allergic reaction, other serious injury, or death. 5. What if there is a serious problem? An allergic reaction could occur after the vaccinated person leaves the clinic. If you see signs of a severe allergic reaction (hives, swelling of the face and throat, difficulty  breathing, a fast heartbeat, dizziness, or weakness), call 9-1-1 and get the person to the nearest hospital. For other signs that concern you, call your health care provider. Adverse reactions should be reported to the Vaccine Adverse Event Reporting System (VAERS). Your health care provider will usually file this report, or you can do it yourself. Visit the VAERS website at www.vaers.SamedayNews.es or call (787) 815-3890. VAERS is only for reporting reactions, and VAERS staff do not give medical advice. 6. How can I learn more? Ask your health care provider. Call your local or state health department. Contact the Centers for Disease Control and Prevention (CDC): Call 581-525-4935 (1-800-CDC-INFO) or Visit CDC's website at http://hunter.com/ Vaccine Information Statement Recombinant Zoster Vaccine (01/18/2018) This information is not intended to replace advice given to you by your health care provider. Make sure you discuss any questions you have with your healthcare provider. Document Revised: 11/09/2019 Document Reviewed: 11/09/2019 Elsevier Patient Education  [place].

## 2020-09-01 NOTE — Progress Notes (Signed)
New Patient Office Visit  Subjective:  Patient ID: [name], female    DOB: 1956/01/29  Age: 65 y.o. MRN: [id number]  CC:  Chief Complaint  Patient presents with   Establish Care    HPI [name] presents to establish care.  She is a pleasant 65 year old female who works in medical office doing billing.  She is under the care of an allergist as well as an OB/GYN.  Both of these specialist to manage her prescribed medications.  She does have a history of vitamin D deficiency and currently takes oral vitamin D replacement daily.  She does have a history of osteopenia and is due for a repeat DEXA scan.  Has had a nonpruritic rash on her bilateral lower extremities for approximately 6 months.  She did try changing detergents/soaps but has not noticed a difference.  Denies significant swelling of the feet and ankles.  Past Medical History:  Diagnosis Date   Abnormal mammogram    Colon polyps    Low bone mass    Prolapsed uterus    RLQ abdominal pain    OVARIAN CYST   Vitamin D deficiency     Past Surgical History:  Procedure Laterality Date   ABDOMINAL HYSTERECTOMY  03/23/1987   VAGINAL    APPENDECTOMY  05/02/2008   BY LAPAROSCOPY   CARDIOVASCULAR STRESS TEST  02/20/2011   10 min, 11 METS; No ishcemia / no Infarction;, METS = 11Peak HR 180 bpm;; Ecellent Exercise Tolerance   CESAREAN SECTION     PELVIC LAPAROSCOPY  05/02/2008   RIGHT SALPINGO-OOPHORECTOMY AND EXCISION  OF CYSTIC RLQ ADHESIONS   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/1999   TUBAL LIGATION      Family History  Problem Relation Age of Onset   Hyperlipidemia Mother    Asthma Mother    Hypertension Mother    Heart disease Mother    Kidney cancer Mother    Congestive Heart Failure Mother    Lung cancer Father     Social History   Socioeconomic History   Marital status: Widowed    Spouse name: Not on file   Number of children: Not on file   Years of education: Not on file   Highest  education level: Not on file  Occupational History   Not on file  Tobacco Use   Smoking status: Never   Smokeless tobacco: Never  Vaping Use   Vaping Use: Never used  Substance and Sexual Activity   Alcohol use: No   Drug use: No   Sexual activity: Not Currently  Other Topics Concern   Not on file  Social History Narrative   She is a married, mother of 3 ratio exercise routinely walking her dog. She does not smoke or drink. Quit smoking in 2000. She works at [employer]. [employer]'s office ([employer])   Social Determinants of Health   Financial Resource Strain: Not on file  Food Insecurity: Not on file  Transportation Needs: Not on file  Physical Activity: Not on file  Stress: Not on file  Social Connections: Not on file  Intimate Partner Violence: Not on file    ROS Review of Systems  Constitutional:  Positive for unexpected weight change. Negative for chills, fatigue and fever.  Respiratory:  Negative for choking, chest tightness, shortness of breath and wheezing.   Cardiovascular:  Negative for chest pain, palpitations and leg swelling.  Gastrointestinal:  Negative for abdominal pain, constipation, diarrhea, nausea and vomiting.  Genitourinary:  Negative  for dysuria, frequency and urgency.  Skin:  Positive for rash (Bilateral lower extremities and dorsal feet).  Neurological:  Negative for dizziness, light-headedness and headaches.  Psychiatric/Behavioral:  Negative for dysphoric mood, self-injury, sleep disturbance and suicidal ideas. The patient is not nervous/anxious.    Objective:   Today's Vitals: BP 124/69   Pulse 60   Temp 98.4 F (36.9 C)   Ht 5\' 3"  (1.6 m)   Wt 124 lb 12.8 oz (56.6 kg)   SpO2 98%   BMI 22.11 kg/m   Physical Exam Vitals reviewed.  Constitutional:      General: She is not in acute distress.    Appearance: Normal appearance.  HENT:     Head: Normocephalic and atraumatic.  Eyes:     General:        Right eye: No discharge.        Left eye: No  discharge.     Extraocular Movements: Extraocular movements intact.     Conjunctiva/sclera: Conjunctivae normal.  Cardiovascular:     Rate and Rhythm: Normal rate and regular rhythm.     Pulses: Normal pulses.     Heart sounds: Normal heart sounds. No murmur heard.   No friction rub. No gallop.  Pulmonary:     Effort: Pulmonary effort is normal. No respiratory distress.     Breath sounds: Normal breath sounds. No wheezing.  Skin:    General: Skin is warm and dry.     Findings: Rash (Erythematous papular rash along the lower extremities from mid calf down to the top of the foot bilaterally) present.  Neurological:     Mental Status: She is alert and oriented to person, place, and time.  Psychiatric:        Mood and Affect: Mood normal.        Behavior: Behavior normal.        Thought Content: Thought content normal.        Judgment: Judgment normal.    Assessment & Plan:   1. Encounter to establish care Reviewed available information and discussed healthcare concerns with patient.  2. Annual physical exam 3. Preventative health care Checking CBC with differential, CMP, and lipid panel today. - CBC with Differential/Platelet - COMPLETE METABOLIC PANEL WITH GFR - Lipid panel  4. Rash On exam, she does have some enlarged veins as well as spider veins noted on her bilateral lower extremities.  Rash likely venous stasis dermatitis.  Recommend using Eucerin as a moisturizing agent.  If not effective, or if the rash becomes itchy, we will trial a short course of a topical steroid.  5. Weight gain Although she has gained about 15 pounds, her weight is still in the normal category.  Recommend discussing her weight changes with her OB/GYN who manages the estrogen replacement.  6. Vitamin D deficiency Checking vitamin D.  Continue oral supplement. - VITAMIN D 25 Hydroxy (Vit-D Deficiency, Fractures)  7. Encounter for screening for HIV 8. Need for hepatitis C screening  test Discussed screening recommendations.  She is agreeable so we will add this to blood work today. - HIV Antibody (routine testing w rflx) - Hepatitis C antibody  9. Osteoporosis screening DEXA scan ordered. - DG Bone Density; Future  10. Thyroid disorder screen Checking TSH. - TSH  11. Need for shingles vaccine Shingrix No. 1 given in office today.  Return in 2 to 6 months for nurse visit to get second vaccination. - Varicella-zoster vaccine IM  Outpatient Encounter Medications as of 09/01/2020  Medication Sig   azelastine (ASTELIN) 0.1 % nasal spray Place 1 spray into both nostrils daily.   B Complex Vitamins (B COMPLEX PO) Take 1 tablet by mouth daily.   B Complex Vitamins (VITAMIN B COMPLEX PO) Take 1 tablet by mouth daily.   Cholecalciferol 50 MCG (2000 UT) TABS Take 2,000 Units by mouth daily.   EPINEPHrine 0.3 mg/0.3 mL IJ SOAJ injection See admin instructions.   fluticasone (FLONASE) 50 MCG/ACT nasal spray Place 1 spray into both nostrils daily.   levocetirizine (XYZAL) 5 MG tablet Take 1 tablet by mouth daily.   montelukast (SINGULAIR) 10 MG tablet Take 1 tablet by mouth daily.   pregabalin (LYRICA) 75 MG capsule Take 75 mg by mouth daily.   VIVELLE-DOT 0.025 MG/24HR Place 1 patch onto the skin 2 (two) times a week.   [DISCONTINUED] estradiol (CLIMARA - DOSED IN MG/24 HR) 0.075 mg/24hr Place 1 patch onto the skin 2 (two) times a week.   [DISCONTINUED] Flaxseed, Linseed, (FLAX SEEDS) POWD Take by mouth 3 (three) times a week.   [DISCONTINUED] nitrofurantoin (MACRODANTIN) 100 MG capsule Take 100 mg by mouth daily.     No facility-administered encounter medications on file as of 09/01/2020.    Follow-up: Return in about 1 year (around 09/01/2021) for annual physical exam or sooner if needed.   Thayer Ohm, DNP, APRN, FNP-BC [facility] and [facility] Medicine

## 2020-09-02 ENCOUNTER — Encounter: Payer: Self-pay | Admitting: Medical-Surgical

## 2020-09-09 LAB — COMPLETE METABOLIC PANEL WITH GFR
AG Ratio: 1.4 (calc) (ref 1.0–2.5)
ALT: 10 U/L (ref 6–29)
AST: 16 U/L (ref 10–35)
Albumin: 4.4 g/dL (ref 3.6–5.1)
Alkaline phosphatase (APISO): 55 U/L (ref 37–153)
BUN: 18 mg/dL (ref 7–25)
CO2: 29 mmol/L (ref 20–32)
Calcium: 9.8 mg/dL (ref 8.6–10.4)
Chloride: 106 mmol/L (ref 98–110)
Creat: 0.92 mg/dL (ref 0.50–0.99)
GFR, Est African American: 77 mL/min/{1.73_m2} (ref >=60)
GFR, Est Non African American: 66 mL/min/{1.73_m2} (ref >=60)
Globulin: 3.1 g/dL (calc) (ref 1.9–3.7)
Glucose, Bld: 91 mg/dL (ref 65–99)
Potassium: 3.9 mmol/L (ref 3.5–5.3)
Sodium: 142 mmol/L (ref 135–146)
Total Bilirubin: 0.6 mg/dL (ref 0.2–1.2)
Total Protein: 7.5 g/dL (ref 6.1–8.1)

## 2020-09-09 LAB — HEPATITIS C ANTIBODY
Hepatitis C Ab: NONREACTIVE
SIGNAL TO CUT-OFF: 0 (ref <1.00)

## 2020-09-09 LAB — CBC WITH DIFFERENTIAL/PLATELET
Absolute Monocytes: 458 cells/uL (ref 200–950)
Basophils Absolute: 68 cells/uL (ref 0–200)
Basophils Relative: 0.9 %
Eosinophils Absolute: 173 cells/uL (ref 15–500)
Eosinophils Relative: 2.3 %
HCT: 39.4 % (ref 35.0–45.0)
Hemoglobin: 13 g/dL (ref 11.7–15.5)
Lymphs Abs: 3375 cells/uL (ref 850–3900)
MCH: 28.4 pg (ref 27.0–33.0)
MCHC: 33 g/dL (ref 32.0–36.0)
MCV: 86.2 fL (ref 80.0–100.0)
MPV: 9.6 fL (ref 7.5–12.5)
Monocytes Relative: 6.1 %
Neutro Abs: 3428 cells/uL (ref 1500–7800)
Neutrophils Relative %: 45.7 %
Platelets: 340 10*3/uL (ref 140–400)
RBC: 4.57 10*6/uL (ref 3.80–5.10)
RDW: 11.9 % (ref 11.0–15.0)
Total Lymphocyte: 45 %
WBC: 7.5 10*3/uL (ref 3.8–10.8)

## 2020-09-09 LAB — LIPID PANEL
Cholesterol: 234 mg/dL — ABNORMAL HIGH (ref <200)
HDL: 64 mg/dL (ref >=50)
LDL Cholesterol (Calc): 150 mg/dL (calc) — ABNORMAL HIGH
Non-HDL Cholesterol (Calc): 170 mg/dL (calc) — ABNORMAL HIGH (ref <130)
Total CHOL/HDL Ratio: 3.7 (calc) (ref <5.0)
Triglycerides: 94 mg/dL (ref <150)

## 2020-09-09 LAB — VITAMIN D 25 HYDROXY (VIT D DEFICIENCY, FRACTURES): Vit D, 25-Hydroxy: 57 ng/mL (ref 30–100)

## 2020-09-09 LAB — HIV ANTIBODY (ROUTINE TESTING W REFLEX): HIV 1&2 Ab, 4th Generation: NONREACTIVE

## 2020-09-09 LAB — TSH: TSH: 3.38 mIU/L (ref 0.40–4.50)

## 2020-09-26 IMAGING — MG DIGITAL SCREENING BILAT W/ TOMO W/ CAD
8 series · 9 of 24 positions shown · non-contrast
Comparison: Previous exam(s).

CLINICAL DATA: Screening.

EXAM:
DIGITAL SCREENING BILATERAL MAMMOGRAM WITH TOMO AND CAD

[R MLO synth-2D]
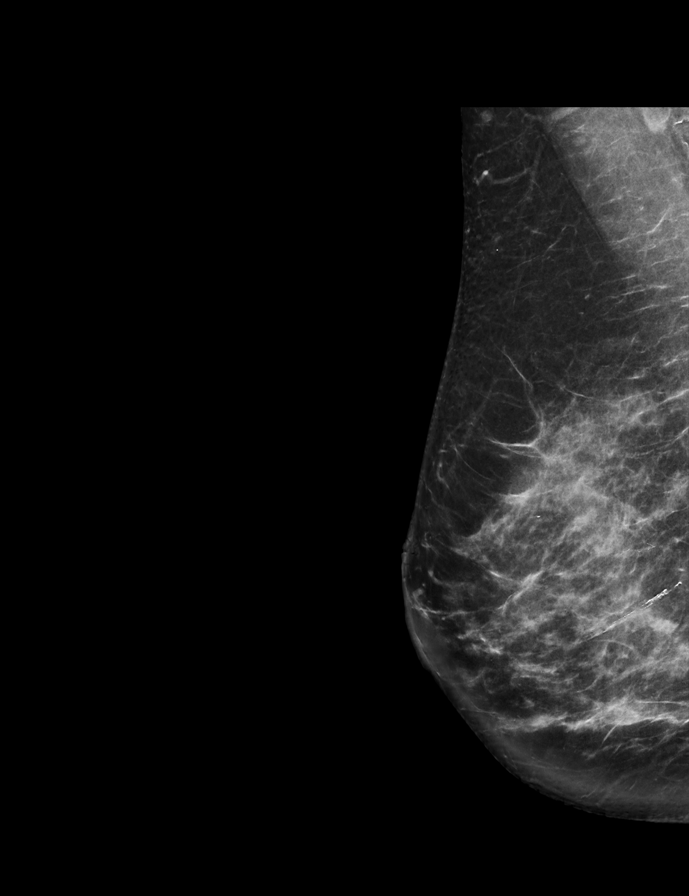

[L MLO synth-2D]
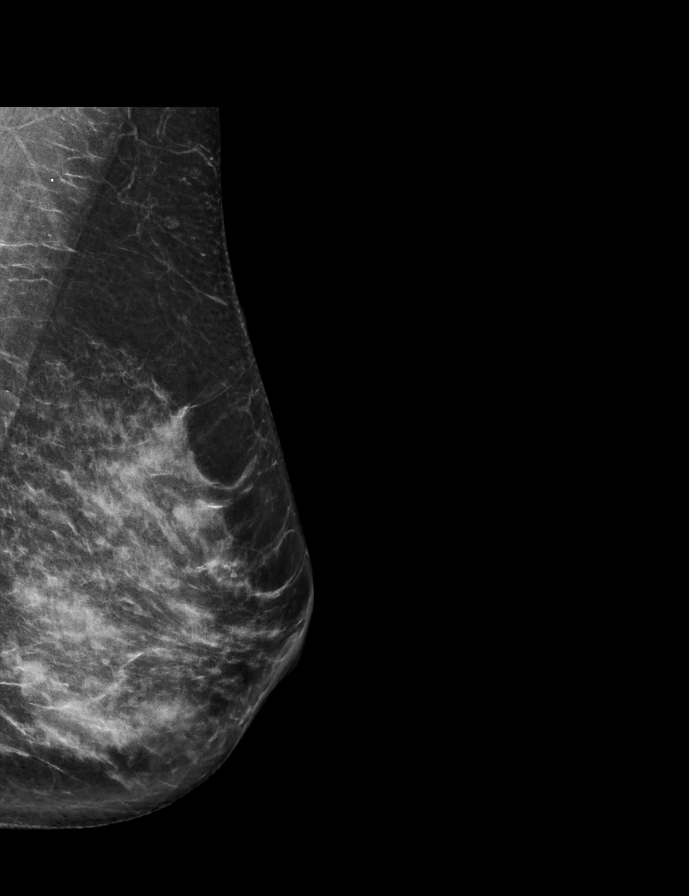

[R CC synth-2D]
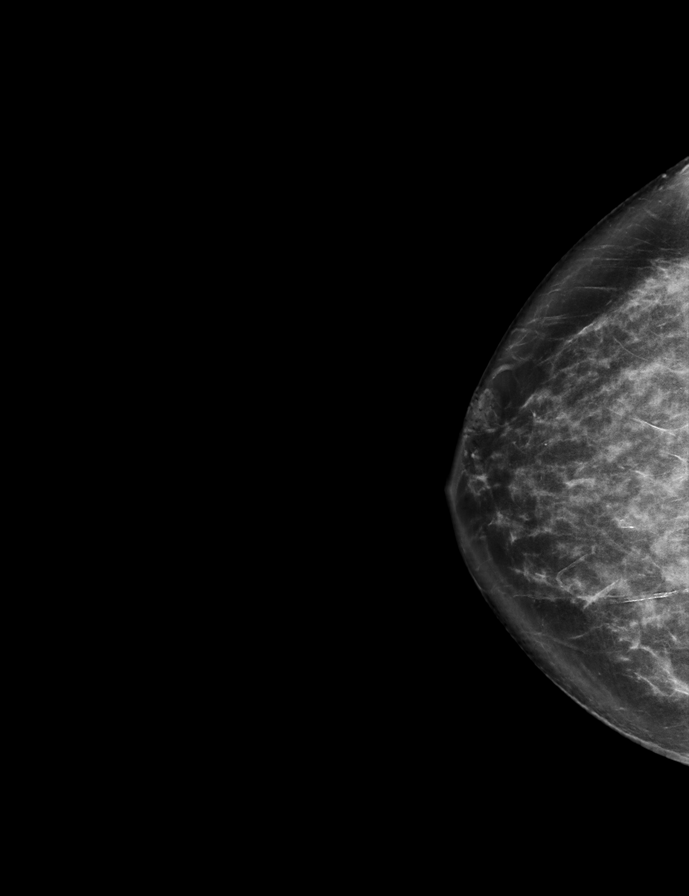

[L CC synth-2D]
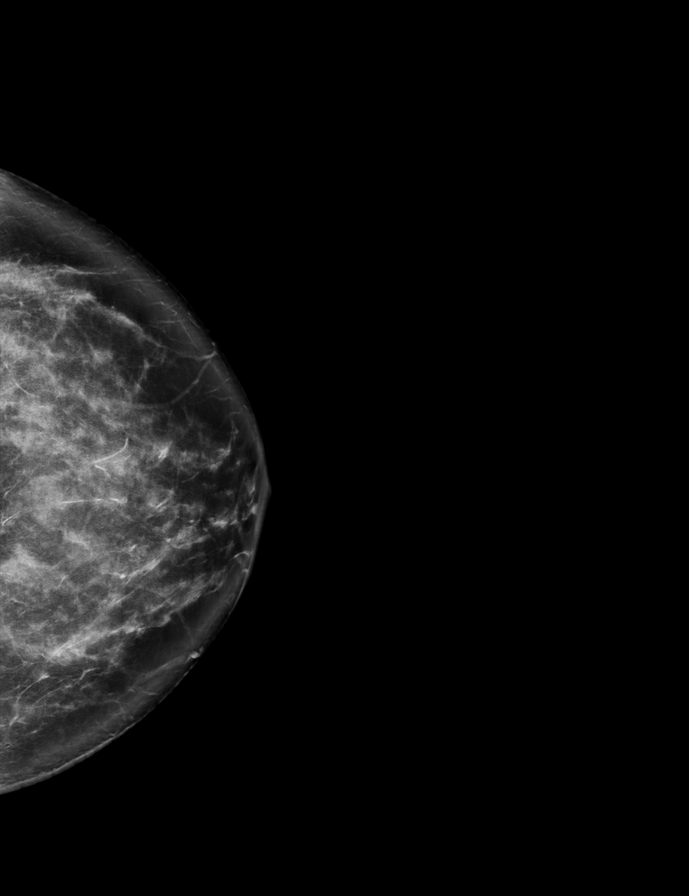

[L CC tomo · 2 of 72 frames shown]
[frame 24/72]
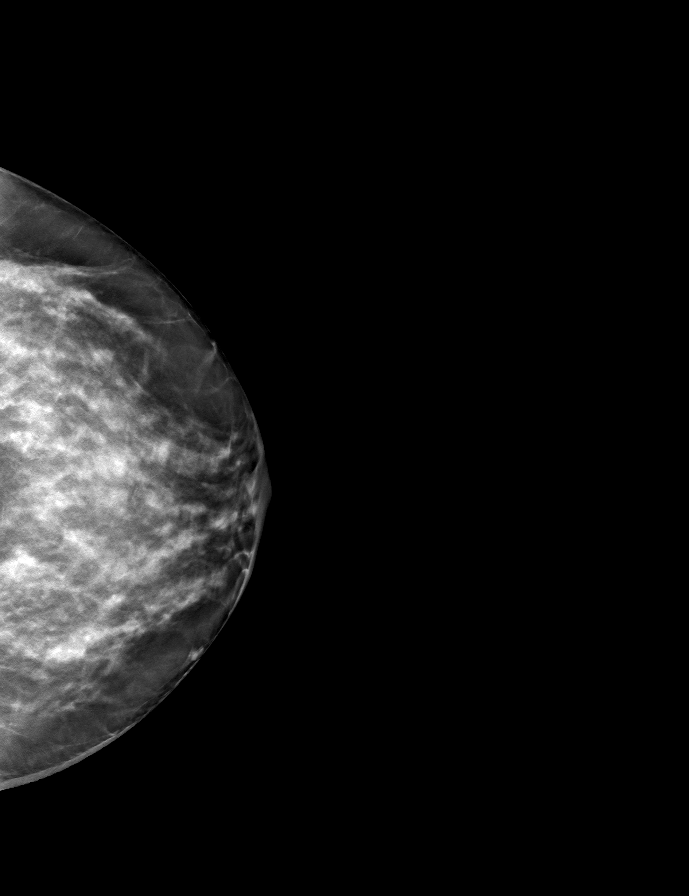
[frame 37/72]
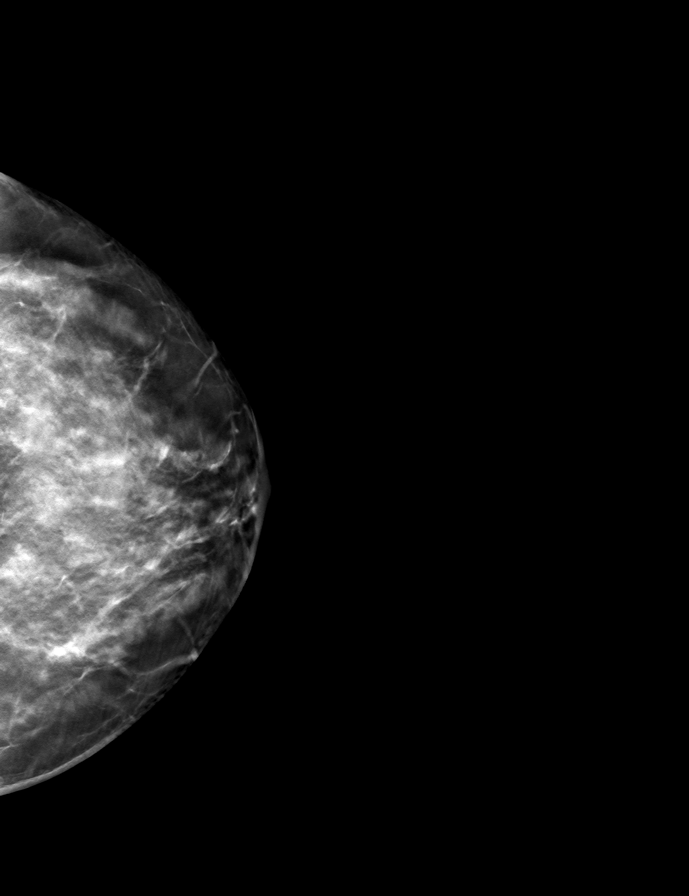

[R CC tomo · tomo slice 35/70.0]
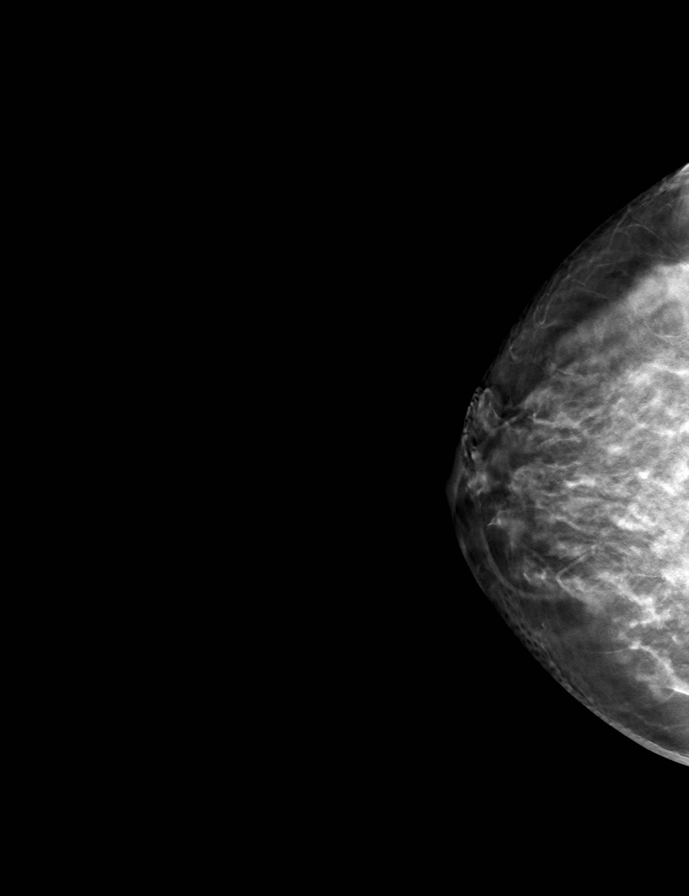

[L MLO tomo · tomo slice 35/70.0]
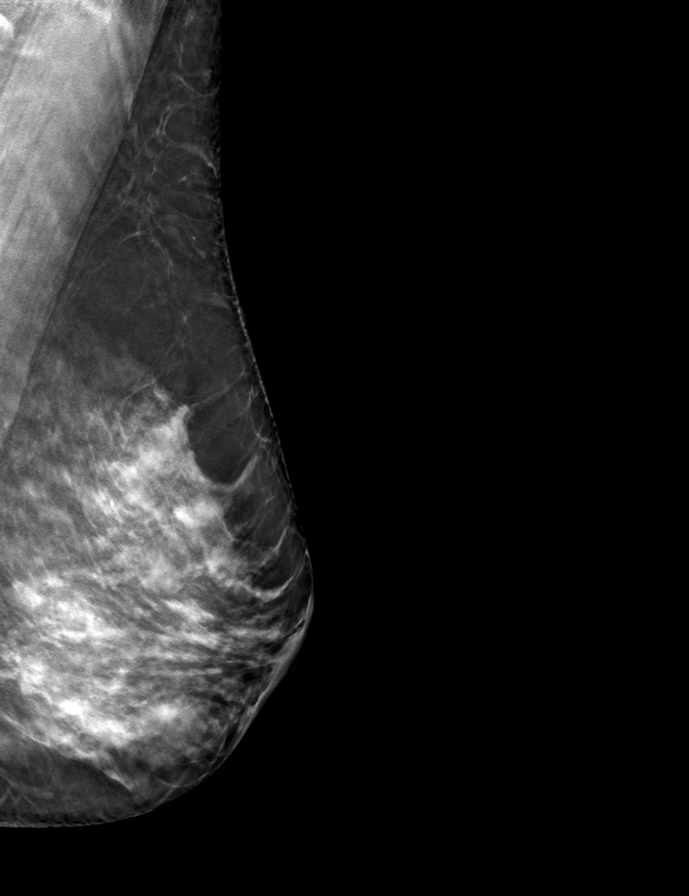

[R MLO tomo · tomo slice 36/71.0]
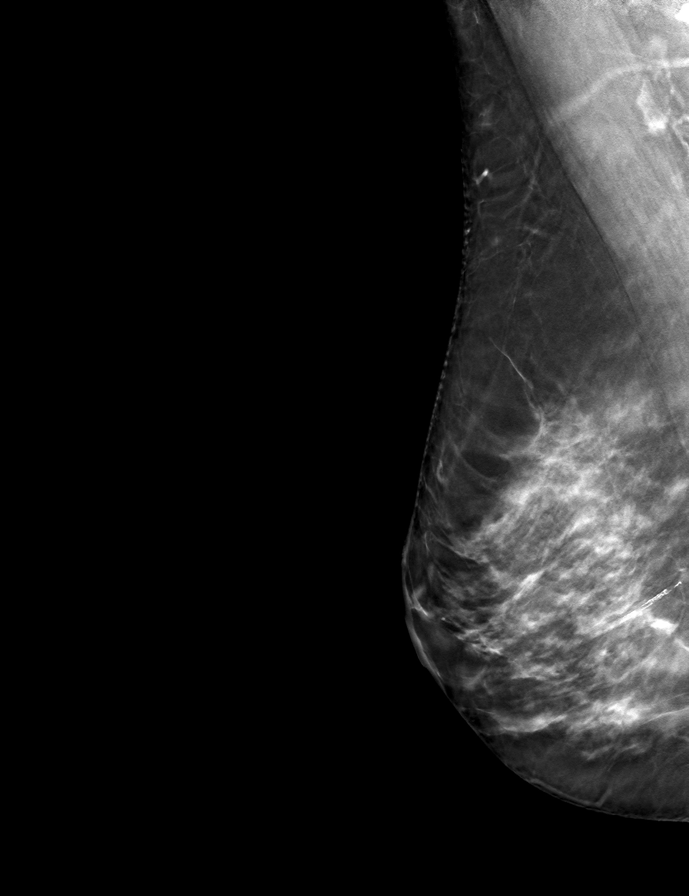

[9 of 24 positions shown; findings below may reference images not displayed]

ACR Breast Density Category c: The breast tissue is heterogeneously
dense, which may obscure small masses.
FINDINGS: In the right breast a possible asymmetry requires further
evaluation.

In the left breast a mass requires further evaluation.

Images were processed with CAD.
IMPRESSION: Further evaluation is suggested for possible asymmetry in the right
breast.

Further evaluation is suggested for possible mass in the left
breast.

RECOMMENDATION:
Diagnostic mammogram and possibly ultrasound of both breasts.
(Code:E7-6-TTI)

The patient will be contacted regarding the findings, and additional
imaging will be scheduled.

BI-RADS CATEGORY  0: Incomplete. Need additional imaging evaluation
and/or prior mammograms for comparison.

## 2020-10-06 ENCOUNTER — Other Ambulatory Visit: Payer: Self-pay | Admitting: Medical-Surgical

## 2020-10-06 DIAGNOSIS — Z1231 Encounter for screening mammogram for malignant neoplasm of breast: Secondary | ICD-10-CM

## 2020-10-15 ENCOUNTER — Ambulatory Visit (INDEPENDENT_AMBULATORY_CARE_PROVIDER_SITE_OTHER): Payer: No Typology Code available for payment source

## 2020-10-15 ENCOUNTER — Other Ambulatory Visit: Payer: Self-pay

## 2020-10-15 DIAGNOSIS — Z13820 Encounter for screening for osteoporosis: Secondary | ICD-10-CM | POA: Diagnosis not present

## 2020-10-21 ENCOUNTER — Other Ambulatory Visit: Payer: Self-pay

## 2020-10-21 ENCOUNTER — Ambulatory Visit
Admission: RE | Admit: 2020-10-21 | Discharge: 2020-10-21 | Disposition: A | Discharge status: Home / Self Care | Payer: No Typology Code available for payment source | Source: Ambulatory Visit | Attending: Medical-Surgical | Admitting: Medical-Surgical

## 2020-10-21 DIAGNOSIS — Z1231 Encounter for screening mammogram for malignant neoplasm of breast: Secondary | ICD-10-CM

## 2020-10-27 ENCOUNTER — Encounter: Payer: Self-pay | Admitting: Medical-Surgical

## 2020-10-27 ENCOUNTER — Ambulatory Visit (INDEPENDENT_AMBULATORY_CARE_PROVIDER_SITE_OTHER): Payer: No Typology Code available for payment source | Admitting: Medical-Surgical

## 2020-10-27 VITALS — BP 134/74 | HR 71 | Resp 20 | Ht 63.0 in | Wt 126.0 lb

## 2020-10-27 DIAGNOSIS — R21 Rash and other nonspecific skin eruption: Secondary | ICD-10-CM

## 2020-10-27 MED ORDER — CLOTRIMAZOLE-BETAMETHASONE 1-0.05 % EX CREA: 1.0000 "application " | TOPICAL_CREAM | Freq: Two times a day (BID) | CUTANEOUS | 0 refills | Status: DC

## 2020-10-27 NOTE — Progress Notes (Signed)
  HPI with pertinent ROS:   CC: rash on arms  HPI: Pleasant 65 year old female presenting today for evaluation for a rash on her bilateral forearms.  Notes the rash has been present for about 3 weeks.  She endorses itching, irritation, skin flaking, and occasional burning.  She has tried Eucerin cream but this was not helpful.  She has had no fevers, chills, chest pain, shortness of breath, throat swelling, pustules, or vesicles.  She is currently being followed by allergy to receive allergy shots on a weekly basis.  Has had no new exposures to foods, medications, cosmetics, chemicals, materials, or environmental agents.  She does have 2 cats at home which she notes an allergy to.  They are inside animals and are not exposed to outside agents.  I reviewed the past medical history, family history, social history, surgical history, and allergies today and no changes were needed.  Please see the problem list section below in epic for further details.   Physical exam:   General: Well Developed, well nourished, and in no acute distress.  Neuro: Alert and oriented x3.  HEENT: Normocephalic, atraumatic.  Skin: Warm and dry.  Scattered macular dark pink lesions to bilateral dorsal forearms, 1 larger patch on the right lateral forearm. Cardiac: Regular rate and rhythm, no murmurs rubs or gallops, no lower extremity edema.  Respiratory: Clear to auscultation bilaterally. Not using accessory muscles, speaking in full sentences.  Impression and Recommendations:    1. Rash Unclear etiology.  Under Woods lamp evaluation, slight fluorescence noted to several spots on bilateral forearms but not all of them.  To cover for fungal etiology as well as irritant dermatitis, starting Lotrisone twice daily.  Advised patient to use this sparingly and a very thin layer twice daily for no more than 14 days.  If rash has not improved in 1 week, return for further evaluation.  Return if symptoms worsen or fail to  improve. ___________________________________________ Thayer Ohm, DNP, APRN, FNP-BC [facility] and [facility] [place]

## 2020-12-01 ENCOUNTER — Ambulatory Visit (INDEPENDENT_AMBULATORY_CARE_PROVIDER_SITE_OTHER): Payer: No Typology Code available for payment source | Admitting: Medical-Surgical

## 2020-12-01 VITALS — Temp 97.5°F

## 2020-12-01 DIAGNOSIS — Z23 Encounter for immunization: Secondary | ICD-10-CM

## 2020-12-01 NOTE — Progress Notes (Signed)
Established Patient Office Visit  Subjective:  Patient ID: [name], female    DOB: 06/30/1955  Age: 65 y.o. MRN: [id number]  CC:  Chief Complaint  Patient presents with   Immunizations    HPI [name] presents for shingles vaccine.   Past Medical History:  Diagnosis Date   Abnormal mammogram    Allergic rhinitis    Chronic cystitis    Colon polyps    Elevated cholesterol    Hemorrhoids    Low bone mass    Prolapsed uterus    RLQ abdominal pain    OVARIAN CYST   Thyroid nodule    Vitamin D deficiency    Vulvodynia     Past Surgical History:  Procedure Laterality Date   ABDOMINAL HYSTERECTOMY  03/23/1987   VAGINAL    APPENDECTOMY  05/02/2008   BY LAPAROSCOPY   CARDIOVASCULAR STRESS TEST  02/20/2011   10 min, 11 METS; No ishcemia / no Infarction;, METS = 11Peak HR 180 bpm;; Ecellent Exercise Tolerance   CESAREAN SECTION     PELVIC LAPAROSCOPY  05/02/2008   RIGHT SALPINGO-OOPHORECTOMY AND EXCISION  OF CYSTIC RLQ ADHESIONS   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/1999   TUBAL LIGATION      Family History  Problem Relation Age of Onset   Hyperlipidemia Mother    Asthma Mother    Hypertension Mother    Heart disease Mother    Kidney cancer Mother    Congestive Heart Failure Mother    Lung cancer Father     Social History   Socioeconomic History   Marital status: Widowed    Spouse name: Not on file   Number of children: Not on file   Years of education: Not on file   Highest education level: Not on file  Occupational History   Not on file  Tobacco Use   Smoking status: Never   Smokeless tobacco: Never  Vaping Use   Vaping Use: Never used  Substance and Sexual Activity   Alcohol use: No   Drug use: No   Sexual activity: Not Currently  Other Topics Concern   Not on file  Social History Narrative   She is a married, mother of 3 ratio exercise routinely walking her dog. She does not smoke or drink. Quit smoking in 2000. She works at [employer]'s office ([employer])   Social Determinants of Health   Financial Resource Strain: Not on file  Food Insecurity: Not on file  Transportation Needs: Not on file  Physical Activity: Not on file  Stress: Not on file  Social Connections: Not on file  Intimate Partner Violence: Not on file    Outpatient Medications Prior to Visit  Medication Sig Dispense Refill   azelastine (ASTELIN) 0.1 % nasal spray Place 1 spray into both nostrils daily.     B Complex Vitamins (B COMPLEX PO) Take 1 tablet by mouth daily.     B Complex Vitamins (VITAMIN B COMPLEX PO) Take 1 tablet by mouth daily.     Cholecalciferol 50 MCG (2000 UT) TABS Take 2,000 Units by mouth daily.     clotrimazole-betamethasone (LOTRISONE) cream Apply 1 application topically 2 (two) times daily. 45 g 0   EPINEPHrine 0.3 mg/0.3 mL IJ SOAJ injection See admin instructions.     fluticasone (FLONASE) 50 MCG/ACT nasal spray Place 1 spray into both nostrils daily.     levocetirizine (XYZAL) 5 MG tablet Take 1 tablet by mouth daily.  montelukast (SINGULAIR) 10 MG tablet Take 1 tablet by mouth daily.     pregabalin (LYRICA) 75 MG capsule Take 75 mg by mouth daily.     VIVELLE-DOT 0.025 MG/24HR Place 1 patch onto the skin 2 (two) times a week.     No facility-administered medications prior to visit.    Allergies  Allergen Reactions   Ciprofloxacin Rash    ROS Review of Systems    Objective:    Physical Exam  Temp (!) 97.5 F (36.4 C) (Temporal)  Wt Readings from Last 3 Encounters:  10/27/20 126 lb (57.2 kg)  09/01/20 124 lb 12.8 oz (56.6 kg)  08/07/13 101 lb (45.8 kg)     Health Maintenance Due  Topic Date Due   PAP SMEAR-Modifier  07/18/2018    There are no preventive care reminders to display for this patient.  Lab Results  Component Value Date   TSH 3.38 09/08/2020   Lab Results  Component Value Date   WBC 7.5 09/08/2020   HGB 13.0 09/08/2020   HCT 39.4 09/08/2020   MCV 86.2 09/08/2020   PLT  340 09/08/2020   Lab Results  Component Value Date   NA 142 09/08/2020   K 3.9 09/08/2020   CO2 29 09/08/2020   GLUCOSE 91 09/08/2020   BUN 18 09/08/2020   CREATININE 0.92 09/08/2020   BILITOT 0.6 09/08/2020   ALKPHOS 52 08/16/2019   AST 16 09/08/2020   ALT 10 09/08/2020   PROT 7.5 09/08/2020   ALBUMIN 3.9 08/16/2019   CALCIUM 9.8 09/08/2020   Lab Results  Component Value Date   CHOL 234 (H) 09/08/2020   Lab Results  Component Value Date   HDL 64 09/08/2020   Lab Results  Component Value Date   LDLCALC 150 (H) 09/08/2020   Lab Results  Component Value Date   TRIG 94 09/08/2020   Lab Results  Component Value Date   CHOLHDL 3.7 09/08/2020   No results found for: HGBA1C    Assessment & Plan:  Shingles vaccine - Patient tolerated injection well without complications.    Problem List Items Addressed This Visit   None Visit Diagnoses     Need for shingles vaccine    -  Primary   Relevant Orders   Varicella-zoster vaccine IM (Shingrix) (Completed)       No orders of the defined types were placed in this encounter.   Follow-up: No follow-ups on file.    Esmond Harps, CMA

## 2021-01-02 ENCOUNTER — Ambulatory Visit (INDEPENDENT_AMBULATORY_CARE_PROVIDER_SITE_OTHER): Payer: No Typology Code available for payment source | Admitting: Medical-Surgical

## 2021-01-02 VITALS — BP 119/57 | HR 66

## 2021-01-02 DIAGNOSIS — Z23 Encounter for immunization: Secondary | ICD-10-CM | POA: Diagnosis not present

## 2021-01-02 NOTE — Progress Notes (Signed)
Patient is here for a flu vaccine. Verified no previous allergy to flu vaccine, eggs, or latex. Flu injection to left deltoid with no apparent complications. Patient advised to call with any problems.   

## 2021-02-24 DIAGNOSIS — N9089 Other specified noninflammatory disorders of vulva and perineum: Secondary | ICD-10-CM | POA: Diagnosis not present

## 2021-02-24 DIAGNOSIS — N951 Menopausal and female climacteric states: Secondary | ICD-10-CM | POA: Diagnosis not present

## 2021-02-25 DIAGNOSIS — J3081 Allergic rhinitis due to animal (cat) (dog) hair and dander: Secondary | ICD-10-CM | POA: Diagnosis not present

## 2021-02-25 DIAGNOSIS — J3089 Other allergic rhinitis: Secondary | ICD-10-CM | POA: Diagnosis not present

## 2021-02-25 DIAGNOSIS — J301 Allergic rhinitis due to pollen: Secondary | ICD-10-CM | POA: Diagnosis not present

## 2021-03-11 DIAGNOSIS — J301 Allergic rhinitis due to pollen: Secondary | ICD-10-CM | POA: Diagnosis not present

## 2021-03-11 DIAGNOSIS — J3089 Other allergic rhinitis: Secondary | ICD-10-CM | POA: Diagnosis not present

## 2021-03-11 DIAGNOSIS — J3081 Allergic rhinitis due to animal (cat) (dog) hair and dander: Secondary | ICD-10-CM | POA: Diagnosis not present

## 2021-03-25 DIAGNOSIS — J3081 Allergic rhinitis due to animal (cat) (dog) hair and dander: Secondary | ICD-10-CM | POA: Diagnosis not present

## 2021-03-25 DIAGNOSIS — J301 Allergic rhinitis due to pollen: Secondary | ICD-10-CM | POA: Diagnosis not present

## 2021-03-25 DIAGNOSIS — J3089 Other allergic rhinitis: Secondary | ICD-10-CM | POA: Diagnosis not present

## 2021-04-08 DIAGNOSIS — J301 Allergic rhinitis due to pollen: Secondary | ICD-10-CM | POA: Diagnosis not present

## 2021-04-08 DIAGNOSIS — J3081 Allergic rhinitis due to animal (cat) (dog) hair and dander: Secondary | ICD-10-CM | POA: Diagnosis not present

## 2021-04-08 DIAGNOSIS — J3089 Other allergic rhinitis: Secondary | ICD-10-CM | POA: Diagnosis not present

## 2021-04-22 DIAGNOSIS — J301 Allergic rhinitis due to pollen: Secondary | ICD-10-CM | POA: Diagnosis not present

## 2021-04-22 DIAGNOSIS — J3081 Allergic rhinitis due to animal (cat) (dog) hair and dander: Secondary | ICD-10-CM | POA: Diagnosis not present

## 2021-04-22 DIAGNOSIS — J3089 Other allergic rhinitis: Secondary | ICD-10-CM | POA: Diagnosis not present

## 2021-05-01 ENCOUNTER — Other Ambulatory Visit: Payer: Self-pay

## 2021-05-01 ENCOUNTER — Ambulatory Visit: Payer: No Typology Code available for payment source | Admitting: Medical-Surgical

## 2021-05-01 ENCOUNTER — Encounter: Payer: Self-pay | Admitting: Medical-Surgical

## 2021-05-01 VITALS — BP 133/73 | HR 67 | Resp 20 | Ht 63.0 in | Wt 127.0 lb

## 2021-05-01 DIAGNOSIS — H9313 Tinnitus, bilateral: Secondary | ICD-10-CM | POA: Diagnosis not present

## 2021-05-01 DIAGNOSIS — H938X3 Other specified disorders of ear, bilateral: Secondary | ICD-10-CM | POA: Diagnosis not present

## 2021-05-01 DIAGNOSIS — R519 Headache, unspecified: Secondary | ICD-10-CM

## 2021-05-01 DIAGNOSIS — R21 Rash and other nonspecific skin eruption: Secondary | ICD-10-CM | POA: Diagnosis not present

## 2021-05-01 MED ORDER — CYCLOBENZAPRINE HCL 10 MG PO TABS: 5.0000 mg | ORAL_TABLET | Freq: Three times a day (TID) | ORAL | 0 refills | Status: DC | PRN

## 2021-05-01 MED ORDER — PREDNISONE 50 MG PO TABS: 50.0000 mg | ORAL_TABLET | Freq: Every day | ORAL | 0 refills | Status: DC

## 2021-05-01 MED ORDER — CLOTRIMAZOLE-BETAMETHASONE 1-0.05 % EX CREA: 1.0000 "application " | TOPICAL_CREAM | Freq: Two times a day (BID) | CUTANEOUS | 0 refills | Status: DC

## 2021-05-01 NOTE — Progress Notes (Signed)
°  HPI with pertinent ROS:   CC: Headache, rash  HPI: Pleasant 66 year old female presenting today for the following:  Over the last couple of months, she has had a persistent headache at the base of her skull in the back that is mostly bothersome when she is laying down.  Notes that the pain feels like a squeezing or pressure along the back of the skull.  During the day, she is okay and does well with taking 200 mg of Advil.  At night, she has difficulty from the moment that she lies down on her pillow.  She has tried changing pillows as well as using heat topically.  Has not found anything that truly helped with the pain.  Notes that Sunday night the pain was severe and that continued into Monday.  She does have pressure in both of her ears and feels as if there is fluid buildup.  Has also noticed that her bilateral tinnitus is worse than it usually is.  Has had a couple of dizzy spells recently that were not attributable to potential dehydration or position changes.  Has had a rash along her anterior right and left leg from the ankles up to the thighs.  The rash is not itchy however it is cosmetically bothersome.  Has not had any new exposures or viral illnesses.  Has been applying Eucerin cream but this does not seem to have made any difference.  I reviewed the past medical history, family history, social history, surgical history, and allergies today and no changes were needed.  Please see the problem list section below in epic for further details.   Physical exam:   General: Well Developed, well nourished, and in no acute distress.  Neuro: Alert and oriented x3.  HEENT: Normocephalic, atraumatic.  Skin: Warm and dry.  Scattered macular erythematous patches along the anterior legs bilaterally from the ankle up to the thigh ranging in size from 1 mm to slightly greater than 1 cm.  Several larger macules appear to have crusting along the top.  No pustules, papules, or vesicles noted. Cardiac:  Regular rate and rhythm, no murmurs rubs or gallops, no lower extremity edema.  Respiratory: Clear to auscultation bilaterally. Not using accessory muscles, speaking in full sentences.  Impression and Recommendations:    1. Occipital headache 2. Tinnitus of both ears 3. Pressure sensation in both ears On exam, posterior cervical muscles near the base of the skull tender to palpation suspect there is a cervicogenic nature to her symptoms.  Could also consider vestibular migraine given the bilateral ear pressure and worsening tinnitus.  Treating with 5-day burst of prednisone 50 mg daily.  Sending in Flexeril 5-10 mg every 8 hours as needed.  4. Rash Unclear etiology.  Several small areas of the rash do appear to be somewhat scaly so sending in Lotrisone to use topically.  Prednisone should address systemic causes.  Advised patient that if no improvement with use of prednisone and topical cream, she should return for further evaluation.  Advised to limit use of cream to no more than twice daily for 14 consecutive days.  Return if symptoms worsen or fail to improve. ___________________________________________ Clearnce Sorrel, DNP, APRN, FNP-BC [facility] [place]

## 2021-05-06 DIAGNOSIS — J3089 Other allergic rhinitis: Secondary | ICD-10-CM | POA: Diagnosis not present

## 2021-05-06 DIAGNOSIS — J3081 Allergic rhinitis due to animal (cat) (dog) hair and dander: Secondary | ICD-10-CM | POA: Diagnosis not present

## 2021-05-06 DIAGNOSIS — J301 Allergic rhinitis due to pollen: Secondary | ICD-10-CM | POA: Diagnosis not present

## 2021-05-19 DIAGNOSIS — J301 Allergic rhinitis due to pollen: Secondary | ICD-10-CM | POA: Diagnosis not present

## 2021-05-19 DIAGNOSIS — J3081 Allergic rhinitis due to animal (cat) (dog) hair and dander: Secondary | ICD-10-CM | POA: Diagnosis not present

## 2021-05-20 DIAGNOSIS — J3089 Other allergic rhinitis: Secondary | ICD-10-CM | POA: Diagnosis not present

## 2021-05-20 DIAGNOSIS — J301 Allergic rhinitis due to pollen: Secondary | ICD-10-CM | POA: Diagnosis not present

## 2021-05-20 DIAGNOSIS — J3081 Allergic rhinitis due to animal (cat) (dog) hair and dander: Secondary | ICD-10-CM | POA: Diagnosis not present

## 2021-06-03 DIAGNOSIS — J3081 Allergic rhinitis due to animal (cat) (dog) hair and dander: Secondary | ICD-10-CM | POA: Diagnosis not present

## 2021-06-03 DIAGNOSIS — J3089 Other allergic rhinitis: Secondary | ICD-10-CM | POA: Diagnosis not present

## 2021-06-03 DIAGNOSIS — J301 Allergic rhinitis due to pollen: Secondary | ICD-10-CM | POA: Diagnosis not present

## 2021-06-17 DIAGNOSIS — J301 Allergic rhinitis due to pollen: Secondary | ICD-10-CM | POA: Diagnosis not present

## 2021-06-17 DIAGNOSIS — J3089 Other allergic rhinitis: Secondary | ICD-10-CM | POA: Diagnosis not present

## 2021-06-17 DIAGNOSIS — J3081 Allergic rhinitis due to animal (cat) (dog) hair and dander: Secondary | ICD-10-CM | POA: Diagnosis not present

## 2021-07-01 DIAGNOSIS — J3089 Other allergic rhinitis: Secondary | ICD-10-CM | POA: Diagnosis not present

## 2021-07-01 DIAGNOSIS — J3081 Allergic rhinitis due to animal (cat) (dog) hair and dander: Secondary | ICD-10-CM | POA: Diagnosis not present

## 2021-07-01 DIAGNOSIS — J301 Allergic rhinitis due to pollen: Secondary | ICD-10-CM | POA: Diagnosis not present

## 2021-07-13 ENCOUNTER — Encounter: Payer: Self-pay | Admitting: Medical-Surgical

## 2021-07-15 DIAGNOSIS — J301 Allergic rhinitis due to pollen: Secondary | ICD-10-CM | POA: Diagnosis not present

## 2021-07-15 DIAGNOSIS — J3089 Other allergic rhinitis: Secondary | ICD-10-CM | POA: Diagnosis not present

## 2021-07-15 DIAGNOSIS — J3081 Allergic rhinitis due to animal (cat) (dog) hair and dander: Secondary | ICD-10-CM | POA: Diagnosis not present

## 2021-07-29 DIAGNOSIS — J301 Allergic rhinitis due to pollen: Secondary | ICD-10-CM | POA: Diagnosis not present

## 2021-07-29 DIAGNOSIS — J3089 Other allergic rhinitis: Secondary | ICD-10-CM | POA: Diagnosis not present

## 2021-07-29 DIAGNOSIS — J3081 Allergic rhinitis due to animal (cat) (dog) hair and dander: Secondary | ICD-10-CM | POA: Diagnosis not present

## 2021-08-12 DIAGNOSIS — J3081 Allergic rhinitis due to animal (cat) (dog) hair and dander: Secondary | ICD-10-CM | POA: Diagnosis not present

## 2021-08-12 DIAGNOSIS — J3089 Other allergic rhinitis: Secondary | ICD-10-CM | POA: Diagnosis not present

## 2021-08-12 DIAGNOSIS — J301 Allergic rhinitis due to pollen: Secondary | ICD-10-CM | POA: Diagnosis not present

## 2021-08-19 DIAGNOSIS — J301 Allergic rhinitis due to pollen: Secondary | ICD-10-CM | POA: Diagnosis not present

## 2021-08-19 DIAGNOSIS — J3081 Allergic rhinitis due to animal (cat) (dog) hair and dander: Secondary | ICD-10-CM | POA: Diagnosis not present

## 2021-08-19 DIAGNOSIS — J3089 Other allergic rhinitis: Secondary | ICD-10-CM | POA: Diagnosis not present

## 2021-08-26 DIAGNOSIS — J3089 Other allergic rhinitis: Secondary | ICD-10-CM | POA: Diagnosis not present

## 2021-08-26 DIAGNOSIS — J301 Allergic rhinitis due to pollen: Secondary | ICD-10-CM | POA: Diagnosis not present

## 2021-08-26 DIAGNOSIS — J3081 Allergic rhinitis due to animal (cat) (dog) hair and dander: Secondary | ICD-10-CM | POA: Diagnosis not present

## 2021-09-01 NOTE — Progress Notes (Unsigned)
Complete physical exam  Patient: Herbert Moorsommie G Xie   DOB: 08/05/1955   66 y.o. Female  MRN: [id number]  Subjective:    No chief complaint on file.   Dacy Henrene DodgeG Nan is a 66 y.o. female who presents today for a complete physical exam. She reports consuming a {diet types:17450} diet. {types:19826} She generally feels {DESC; WELL/FAIRLY WELL/POORLY:18703}. She reports sleeping {DESC; WELL/FAIRLY WELL/POORLY:18703}. She {does/does not:200015} have additional problems to discuss today.    Most recent fall risk assessment:    09/01/2020   11:39 AM  Fall Risk   Falls in the past year? 0  Number falls in past yr: 0  Injury with Fall? 0  Follow up Falls evaluation completed     Most recent depression screenings:    09/01/2020   11:38 AM  PHQ 2/9 Scores  PHQ - 2 Score 0    {VISON DENTAL STD PSA (Optional):27386}  {History (Optional):23778}  Patient Care Team: Christen ButterJessup, Tata Timmins, NP as PCP - General (Nurse Practitioner)   Outpatient Medications Prior to Visit  Medication Sig   azelastine (ASTELIN) 0.1 % nasal spray Place 1 spray into both nostrils daily.   B Complex Vitamins (B COMPLEX PO) Take 1 tablet by mouth daily.   B Complex Vitamins (VITAMIN B COMPLEX PO) Take 1 tablet by mouth daily.   Cholecalciferol 50 MCG (2000 UT) TABS Take 2,000 Units by mouth daily.   clotrimazole-betamethasone (LOTRISONE) cream Apply 1 application topically 2 (two) times daily.   EPINEPHrine 0.3 mg/0.3 mL IJ SOAJ injection See admin instructions.   fluticasone (FLONASE) 50 MCG/ACT nasal spray Place 1 spray into both nostrils daily.   levocetirizine (XYZAL) 5 MG tablet Take 1 tablet by mouth daily.   montelukast (SINGULAIR) 10 MG tablet Take 1 tablet by mouth daily.   pregabalin (LYRICA) 75 MG capsule Take 75 mg by mouth daily.   VIVELLE-DOT 0.025 MG/24HR Place 1 patch onto the skin 2 (two) times a week.   [DISCONTINUED] cyclobenzaprine (FLEXERIL) 10 MG tablet Take 0.5-1 tablets (5-10 mg total) by mouth 3  (three) times daily as needed for muscle spasms.   [DISCONTINUED] predniSONE (DELTASONE) 50 MG tablet Take 1 tablet (50 mg total) by mouth daily.   No facility-administered medications prior to visit.    ROS        Objective:     There were no vitals taken for this visit. {Vitals History (Optional):23777}  Physical Exam   No results found for any visits on 09/02/21. {Show previous labs (optional):23779}    Assessment & Plan:    Routine Health Maintenance and Physical Exam  Immunization History  Administered Date(s) Administered   Influenza, High Dose Seasonal PF 05/19/2020   Influenza,inj,Quad PF,6+ Mos 01/02/2021   Influenza-Unspecified 12/20/2017   PFIZER(Purple Top)SARS-COV-2 Vaccination 07/03/2020, 07/31/2020   Pneumococcal Conjugate-13 06/17/2017   Tdap 06/17/2017   Zoster Recombinat (Shingrix) 09/01/2020, 12/01/2020    Health Maintenance  Topic Date Due   COVID-19 Vaccine (3 - Pfizer series) 09/25/2020   INFLUENZA VACCINE  10/20/2021   MAMMOGRAM  10/22/2022   COLONOSCOPY (Pts 45-4568yrs Insurance coverage will need to be confirmed)  08/08/2026   TETANUS/TDAP  06/18/2027   Hepatitis C Screening  Completed   HIV Screening  Completed   Zoster Vaccines- Shingrix  Completed   HPV VACCINES  Aged Out    Discussed health benefits of physical activity, and encouraged her to engage in regular exercise appropriate for her age and condition.  Problem List Items Addressed This Visit  None Visit Diagnoses     Annual physical exam    -  Primary   Encounter for screening mammogram for malignant neoplasm of breast          No follow-ups on file.     Samuel Bouche, NP

## 2021-09-02 ENCOUNTER — Ambulatory Visit (INDEPENDENT_AMBULATORY_CARE_PROVIDER_SITE_OTHER): Payer: BC Managed Care – PPO | Admitting: Medical-Surgical

## 2021-09-02 ENCOUNTER — Encounter: Payer: Self-pay | Admitting: Medical-Surgical

## 2021-09-02 VITALS — BP 119/69 | HR 69 | Resp 20 | Ht 63.0 in | Wt 124.6 lb

## 2021-09-02 DIAGNOSIS — Z1329 Encounter for screening for other suspected endocrine disorder: Secondary | ICD-10-CM | POA: Diagnosis not present

## 2021-09-02 DIAGNOSIS — Z131 Encounter for screening for diabetes mellitus: Secondary | ICD-10-CM

## 2021-09-02 DIAGNOSIS — J3081 Allergic rhinitis due to animal (cat) (dog) hair and dander: Secondary | ICD-10-CM | POA: Insufficient documentation

## 2021-09-02 DIAGNOSIS — Z0000 Encounter for general adult medical examination without abnormal findings: Secondary | ICD-10-CM | POA: Diagnosis not present

## 2021-09-02 DIAGNOSIS — J301 Allergic rhinitis due to pollen: Secondary | ICD-10-CM | POA: Diagnosis not present

## 2021-09-02 DIAGNOSIS — Z1231 Encounter for screening mammogram for malignant neoplasm of breast: Secondary | ICD-10-CM

## 2021-09-02 DIAGNOSIS — J3089 Other allergic rhinitis: Secondary | ICD-10-CM | POA: Diagnosis not present

## 2021-09-03 LAB — CBC WITH DIFFERENTIAL/PLATELET
Absolute Monocytes: 561 cells/uL (ref 200–950)
Basophils Absolute: 71 cells/uL (ref 0–200)
Basophils Relative: 0.7 %
Eosinophils Absolute: 133 cells/uL (ref 15–500)
Eosinophils Relative: 1.3 %
HCT: 43.4 % (ref 35.0–45.0)
Hemoglobin: 14.8 g/dL (ref 11.7–15.5)
Lymphs Abs: 2530 cells/uL (ref 850–3900)
MCH: 30.1 pg (ref 27.0–33.0)
MCHC: 34.1 g/dL (ref 32.0–36.0)
MCV: 88.2 fL (ref 80.0–100.0)
MPV: 9.9 fL (ref 7.5–12.5)
Monocytes Relative: 5.5 %
Neutro Abs: 6905 cells/uL (ref 1500–7800)
Neutrophils Relative %: 67.7 %
Platelets: 313 10*3/uL (ref 140–400)
RBC: 4.92 10*6/uL (ref 3.80–5.10)
RDW: 11.8 % (ref 11.0–15.0)
Total Lymphocyte: 24.8 %
WBC: 10.2 10*3/uL (ref 3.8–10.8)

## 2021-09-03 LAB — COMPLETE METABOLIC PANEL WITH GFR
AG Ratio: 1.5 (calc) (ref 1.0–2.5)
ALT: 12 U/L (ref 6–29)
AST: 17 U/L (ref 10–35)
Albumin: 4.5 g/dL (ref 3.6–5.1)
Alkaline phosphatase (APISO): 56 U/L (ref 37–153)
BUN: 16 mg/dL (ref 7–25)
CO2: 28 mmol/L (ref 20–32)
Calcium: 10.2 mg/dL (ref 8.6–10.4)
Chloride: 106 mmol/L (ref 98–110)
Creat: 0.88 mg/dL (ref 0.50–1.05)
Globulin: 3.1 g/dL (calc) (ref 1.9–3.7)
Glucose, Bld: 90 mg/dL (ref 65–99)
Potassium: 4.2 mmol/L (ref 3.5–5.3)
Sodium: 142 mmol/L (ref 135–146)
Total Bilirubin: 0.9 mg/dL (ref 0.2–1.2)
Total Protein: 7.6 g/dL (ref 6.1–8.1)
eGFR: 73 mL/min/{1.73_m2} (ref >=60)

## 2021-09-03 LAB — LIPID PANEL
Cholesterol: 238 mg/dL — ABNORMAL HIGH (ref <200)
HDL: 67 mg/dL (ref >=50)
LDL Cholesterol (Calc): 144 mg/dL (calc) — ABNORMAL HIGH
Non-HDL Cholesterol (Calc): 171 mg/dL (calc) — ABNORMAL HIGH (ref <130)
Total CHOL/HDL Ratio: 3.6 (calc) (ref <5.0)
Triglycerides: 144 mg/dL (ref <150)

## 2021-09-03 LAB — HEMOGLOBIN A1C
Hgb A1c MFr Bld: 5 % of total Hgb (ref <5.7)
Mean Plasma Glucose: 97 mg/dL
eAG (mmol/L): 5.4 mmol/L

## 2021-09-03 LAB — TSH: TSH: 2.1 mIU/L (ref 0.40–4.50)

## 2021-09-09 DIAGNOSIS — J3089 Other allergic rhinitis: Secondary | ICD-10-CM | POA: Diagnosis not present

## 2021-09-09 DIAGNOSIS — J301 Allergic rhinitis due to pollen: Secondary | ICD-10-CM | POA: Diagnosis not present

## 2021-09-09 DIAGNOSIS — J3081 Allergic rhinitis due to animal (cat) (dog) hair and dander: Secondary | ICD-10-CM | POA: Diagnosis not present

## 2021-09-14 ENCOUNTER — Other Ambulatory Visit: Payer: Self-pay | Admitting: Medical-Surgical

## 2021-09-14 DIAGNOSIS — Z1231 Encounter for screening mammogram for malignant neoplasm of breast: Secondary | ICD-10-CM

## 2021-09-16 DIAGNOSIS — J3089 Other allergic rhinitis: Secondary | ICD-10-CM | POA: Diagnosis not present

## 2021-09-16 DIAGNOSIS — J3081 Allergic rhinitis due to animal (cat) (dog) hair and dander: Secondary | ICD-10-CM | POA: Diagnosis not present

## 2021-09-16 DIAGNOSIS — J301 Allergic rhinitis due to pollen: Secondary | ICD-10-CM | POA: Diagnosis not present

## 2021-09-30 DIAGNOSIS — J3081 Allergic rhinitis due to animal (cat) (dog) hair and dander: Secondary | ICD-10-CM | POA: Diagnosis not present

## 2021-09-30 DIAGNOSIS — J301 Allergic rhinitis due to pollen: Secondary | ICD-10-CM | POA: Diagnosis not present

## 2021-09-30 DIAGNOSIS — J3089 Other allergic rhinitis: Secondary | ICD-10-CM | POA: Diagnosis not present

## 2021-10-14 DIAGNOSIS — J3081 Allergic rhinitis due to animal (cat) (dog) hair and dander: Secondary | ICD-10-CM | POA: Diagnosis not present

## 2021-10-14 DIAGNOSIS — J301 Allergic rhinitis due to pollen: Secondary | ICD-10-CM | POA: Diagnosis not present

## 2021-10-14 DIAGNOSIS — J3089 Other allergic rhinitis: Secondary | ICD-10-CM | POA: Diagnosis not present

## 2021-10-22 ENCOUNTER — Ambulatory Visit
Admission: RE | Admit: 2021-10-22 | Discharge: 2021-10-22 | Disposition: A | Discharge status: Home / Self Care | Payer: BC Managed Care – PPO | Source: Ambulatory Visit | Attending: Medical-Surgical | Admitting: Medical-Surgical

## 2021-10-22 DIAGNOSIS — Z1231 Encounter for screening mammogram for malignant neoplasm of breast: Secondary | ICD-10-CM

## 2021-10-28 DIAGNOSIS — J3089 Other allergic rhinitis: Secondary | ICD-10-CM | POA: Diagnosis not present

## 2021-10-28 DIAGNOSIS — J301 Allergic rhinitis due to pollen: Secondary | ICD-10-CM | POA: Diagnosis not present

## 2021-10-28 DIAGNOSIS — J3081 Allergic rhinitis due to animal (cat) (dog) hair and dander: Secondary | ICD-10-CM | POA: Diagnosis not present

## 2021-11-11 DIAGNOSIS — J3089 Other allergic rhinitis: Secondary | ICD-10-CM | POA: Diagnosis not present

## 2021-11-11 DIAGNOSIS — J301 Allergic rhinitis due to pollen: Secondary | ICD-10-CM | POA: Diagnosis not present

## 2021-11-11 DIAGNOSIS — J3081 Allergic rhinitis due to animal (cat) (dog) hair and dander: Secondary | ICD-10-CM | POA: Diagnosis not present

## 2021-11-25 DIAGNOSIS — J301 Allergic rhinitis due to pollen: Secondary | ICD-10-CM | POA: Diagnosis not present

## 2021-11-25 DIAGNOSIS — J3089 Other allergic rhinitis: Secondary | ICD-10-CM | POA: Diagnosis not present

## 2021-11-25 DIAGNOSIS — J3081 Allergic rhinitis due to animal (cat) (dog) hair and dander: Secondary | ICD-10-CM | POA: Diagnosis not present

## 2021-12-09 DIAGNOSIS — J3081 Allergic rhinitis due to animal (cat) (dog) hair and dander: Secondary | ICD-10-CM | POA: Diagnosis not present

## 2021-12-09 DIAGNOSIS — J301 Allergic rhinitis due to pollen: Secondary | ICD-10-CM | POA: Diagnosis not present

## 2021-12-09 DIAGNOSIS — J3089 Other allergic rhinitis: Secondary | ICD-10-CM | POA: Diagnosis not present

## 2021-12-23 DIAGNOSIS — J3089 Other allergic rhinitis: Secondary | ICD-10-CM | POA: Diagnosis not present

## 2021-12-23 DIAGNOSIS — J3081 Allergic rhinitis due to animal (cat) (dog) hair and dander: Secondary | ICD-10-CM | POA: Diagnosis not present

## 2021-12-23 DIAGNOSIS — J301 Allergic rhinitis due to pollen: Secondary | ICD-10-CM | POA: Diagnosis not present

## 2022-01-06 DIAGNOSIS — J3089 Other allergic rhinitis: Secondary | ICD-10-CM | POA: Diagnosis not present

## 2022-01-06 DIAGNOSIS — J3081 Allergic rhinitis due to animal (cat) (dog) hair and dander: Secondary | ICD-10-CM | POA: Diagnosis not present

## 2022-01-06 DIAGNOSIS — J301 Allergic rhinitis due to pollen: Secondary | ICD-10-CM | POA: Diagnosis not present

## 2022-01-20 DIAGNOSIS — J3081 Allergic rhinitis due to animal (cat) (dog) hair and dander: Secondary | ICD-10-CM | POA: Diagnosis not present

## 2022-01-20 DIAGNOSIS — J301 Allergic rhinitis due to pollen: Secondary | ICD-10-CM | POA: Diagnosis not present

## 2022-01-20 DIAGNOSIS — J3089 Other allergic rhinitis: Secondary | ICD-10-CM | POA: Diagnosis not present

## 2022-02-03 DIAGNOSIS — J3089 Other allergic rhinitis: Secondary | ICD-10-CM | POA: Diagnosis not present

## 2022-02-03 DIAGNOSIS — J3081 Allergic rhinitis due to animal (cat) (dog) hair and dander: Secondary | ICD-10-CM | POA: Diagnosis not present

## 2022-02-03 DIAGNOSIS — J301 Allergic rhinitis due to pollen: Secondary | ICD-10-CM | POA: Diagnosis not present

## 2022-02-04 DIAGNOSIS — J3089 Other allergic rhinitis: Secondary | ICD-10-CM | POA: Diagnosis not present

## 2022-02-17 DIAGNOSIS — J3081 Allergic rhinitis due to animal (cat) (dog) hair and dander: Secondary | ICD-10-CM | POA: Diagnosis not present

## 2022-02-17 DIAGNOSIS — J3089 Other allergic rhinitis: Secondary | ICD-10-CM | POA: Diagnosis not present

## 2022-02-17 DIAGNOSIS — J301 Allergic rhinitis due to pollen: Secondary | ICD-10-CM | POA: Diagnosis not present

## 2022-02-23 DIAGNOSIS — N898 Other specified noninflammatory disorders of vagina: Secondary | ICD-10-CM | POA: Diagnosis not present

## 2022-02-23 DIAGNOSIS — N9089 Other specified noninflammatory disorders of vulva and perineum: Secondary | ICD-10-CM | POA: Diagnosis not present

## 2022-03-03 DIAGNOSIS — J301 Allergic rhinitis due to pollen: Secondary | ICD-10-CM | POA: Diagnosis not present

## 2022-03-03 DIAGNOSIS — J3081 Allergic rhinitis due to animal (cat) (dog) hair and dander: Secondary | ICD-10-CM | POA: Diagnosis not present

## 2022-03-03 DIAGNOSIS — J3089 Other allergic rhinitis: Secondary | ICD-10-CM | POA: Diagnosis not present

## 2022-03-13 DIAGNOSIS — J0100 Acute maxillary sinusitis, unspecified: Secondary | ICD-10-CM | POA: Diagnosis not present

## 2022-03-17 DIAGNOSIS — J3081 Allergic rhinitis due to animal (cat) (dog) hair and dander: Secondary | ICD-10-CM | POA: Diagnosis not present

## 2022-03-17 DIAGNOSIS — J301 Allergic rhinitis due to pollen: Secondary | ICD-10-CM | POA: Diagnosis not present

## 2022-03-17 DIAGNOSIS — J3089 Other allergic rhinitis: Secondary | ICD-10-CM | POA: Diagnosis not present

## 2022-03-31 DIAGNOSIS — J3089 Other allergic rhinitis: Secondary | ICD-10-CM | POA: Diagnosis not present

## 2022-03-31 DIAGNOSIS — J301 Allergic rhinitis due to pollen: Secondary | ICD-10-CM | POA: Diagnosis not present

## 2022-03-31 DIAGNOSIS — J3081 Allergic rhinitis due to animal (cat) (dog) hair and dander: Secondary | ICD-10-CM | POA: Diagnosis not present

## 2022-04-14 DIAGNOSIS — J3081 Allergic rhinitis due to animal (cat) (dog) hair and dander: Secondary | ICD-10-CM | POA: Diagnosis not present

## 2022-04-14 DIAGNOSIS — J3089 Other allergic rhinitis: Secondary | ICD-10-CM | POA: Diagnosis not present

## 2022-04-14 DIAGNOSIS — J301 Allergic rhinitis due to pollen: Secondary | ICD-10-CM | POA: Diagnosis not present

## 2022-04-28 DIAGNOSIS — J301 Allergic rhinitis due to pollen: Secondary | ICD-10-CM | POA: Diagnosis not present

## 2022-04-28 DIAGNOSIS — J3081 Allergic rhinitis due to animal (cat) (dog) hair and dander: Secondary | ICD-10-CM | POA: Diagnosis not present

## 2022-04-28 DIAGNOSIS — J3089 Other allergic rhinitis: Secondary | ICD-10-CM | POA: Diagnosis not present

## 2022-05-12 DIAGNOSIS — J301 Allergic rhinitis due to pollen: Secondary | ICD-10-CM | POA: Diagnosis not present

## 2022-05-12 DIAGNOSIS — J3081 Allergic rhinitis due to animal (cat) (dog) hair and dander: Secondary | ICD-10-CM | POA: Diagnosis not present

## 2022-05-12 DIAGNOSIS — J3089 Other allergic rhinitis: Secondary | ICD-10-CM | POA: Diagnosis not present

## 2022-05-18 DIAGNOSIS — J301 Allergic rhinitis due to pollen: Secondary | ICD-10-CM | POA: Diagnosis not present

## 2022-05-18 DIAGNOSIS — J3081 Allergic rhinitis due to animal (cat) (dog) hair and dander: Secondary | ICD-10-CM | POA: Diagnosis not present

## 2022-05-18 DIAGNOSIS — J3089 Other allergic rhinitis: Secondary | ICD-10-CM | POA: Diagnosis not present

## 2022-05-25 DIAGNOSIS — J301 Allergic rhinitis due to pollen: Secondary | ICD-10-CM | POA: Diagnosis not present

## 2022-05-25 DIAGNOSIS — J3089 Other allergic rhinitis: Secondary | ICD-10-CM | POA: Diagnosis not present

## 2022-05-25 DIAGNOSIS — J3081 Allergic rhinitis due to animal (cat) (dog) hair and dander: Secondary | ICD-10-CM | POA: Diagnosis not present

## 2022-06-01 DIAGNOSIS — J3081 Allergic rhinitis due to animal (cat) (dog) hair and dander: Secondary | ICD-10-CM | POA: Diagnosis not present

## 2022-06-01 DIAGNOSIS — J301 Allergic rhinitis due to pollen: Secondary | ICD-10-CM | POA: Diagnosis not present

## 2022-06-01 DIAGNOSIS — J3089 Other allergic rhinitis: Secondary | ICD-10-CM | POA: Diagnosis not present

## 2022-06-08 DIAGNOSIS — J3089 Other allergic rhinitis: Secondary | ICD-10-CM | POA: Diagnosis not present

## 2022-06-08 DIAGNOSIS — J3081 Allergic rhinitis due to animal (cat) (dog) hair and dander: Secondary | ICD-10-CM | POA: Diagnosis not present

## 2022-06-08 DIAGNOSIS — J301 Allergic rhinitis due to pollen: Secondary | ICD-10-CM | POA: Diagnosis not present

## 2022-06-15 DIAGNOSIS — J3089 Other allergic rhinitis: Secondary | ICD-10-CM | POA: Diagnosis not present

## 2022-06-15 DIAGNOSIS — J3081 Allergic rhinitis due to animal (cat) (dog) hair and dander: Secondary | ICD-10-CM | POA: Diagnosis not present

## 2022-06-15 DIAGNOSIS — J301 Allergic rhinitis due to pollen: Secondary | ICD-10-CM | POA: Diagnosis not present

## 2022-06-22 DIAGNOSIS — J3081 Allergic rhinitis due to animal (cat) (dog) hair and dander: Secondary | ICD-10-CM | POA: Diagnosis not present

## 2022-06-22 DIAGNOSIS — J301 Allergic rhinitis due to pollen: Secondary | ICD-10-CM | POA: Diagnosis not present

## 2022-06-22 DIAGNOSIS — J3089 Other allergic rhinitis: Secondary | ICD-10-CM | POA: Diagnosis not present

## 2022-06-29 DIAGNOSIS — J3089 Other allergic rhinitis: Secondary | ICD-10-CM | POA: Diagnosis not present

## 2022-06-29 DIAGNOSIS — J301 Allergic rhinitis due to pollen: Secondary | ICD-10-CM | POA: Diagnosis not present

## 2022-06-29 DIAGNOSIS — J3081 Allergic rhinitis due to animal (cat) (dog) hair and dander: Secondary | ICD-10-CM | POA: Diagnosis not present

## 2022-07-01 DIAGNOSIS — J3089 Other allergic rhinitis: Secondary | ICD-10-CM | POA: Diagnosis not present

## 2022-07-01 DIAGNOSIS — J301 Allergic rhinitis due to pollen: Secondary | ICD-10-CM | POA: Diagnosis not present

## 2022-07-01 DIAGNOSIS — J3081 Allergic rhinitis due to animal (cat) (dog) hair and dander: Secondary | ICD-10-CM | POA: Diagnosis not present

## 2022-07-06 DIAGNOSIS — J3089 Other allergic rhinitis: Secondary | ICD-10-CM | POA: Diagnosis not present

## 2022-07-06 DIAGNOSIS — J301 Allergic rhinitis due to pollen: Secondary | ICD-10-CM | POA: Diagnosis not present

## 2022-07-06 DIAGNOSIS — J3081 Allergic rhinitis due to animal (cat) (dog) hair and dander: Secondary | ICD-10-CM | POA: Diagnosis not present

## 2022-07-13 DIAGNOSIS — J301 Allergic rhinitis due to pollen: Secondary | ICD-10-CM | POA: Diagnosis not present

## 2022-07-13 DIAGNOSIS — J3089 Other allergic rhinitis: Secondary | ICD-10-CM | POA: Diagnosis not present

## 2022-07-13 DIAGNOSIS — J3081 Allergic rhinitis due to animal (cat) (dog) hair and dander: Secondary | ICD-10-CM | POA: Diagnosis not present

## 2022-07-21 DIAGNOSIS — J301 Allergic rhinitis due to pollen: Secondary | ICD-10-CM | POA: Diagnosis not present

## 2022-07-21 DIAGNOSIS — J3089 Other allergic rhinitis: Secondary | ICD-10-CM | POA: Diagnosis not present

## 2022-07-21 DIAGNOSIS — J3081 Allergic rhinitis due to animal (cat) (dog) hair and dander: Secondary | ICD-10-CM | POA: Diagnosis not present

## 2022-07-29 DIAGNOSIS — J3081 Allergic rhinitis due to animal (cat) (dog) hair and dander: Secondary | ICD-10-CM | POA: Diagnosis not present

## 2022-07-29 DIAGNOSIS — J301 Allergic rhinitis due to pollen: Secondary | ICD-10-CM | POA: Diagnosis not present

## 2022-07-29 DIAGNOSIS — J3089 Other allergic rhinitis: Secondary | ICD-10-CM | POA: Diagnosis not present

## 2022-08-05 DIAGNOSIS — J301 Allergic rhinitis due to pollen: Secondary | ICD-10-CM | POA: Diagnosis not present

## 2022-08-05 DIAGNOSIS — J3081 Allergic rhinitis due to animal (cat) (dog) hair and dander: Secondary | ICD-10-CM | POA: Diagnosis not present

## 2022-08-05 DIAGNOSIS — J3089 Other allergic rhinitis: Secondary | ICD-10-CM | POA: Diagnosis not present

## 2022-08-12 DIAGNOSIS — J3089 Other allergic rhinitis: Secondary | ICD-10-CM | POA: Diagnosis not present

## 2022-08-12 DIAGNOSIS — J301 Allergic rhinitis due to pollen: Secondary | ICD-10-CM | POA: Diagnosis not present

## 2022-08-12 DIAGNOSIS — J3081 Allergic rhinitis due to animal (cat) (dog) hair and dander: Secondary | ICD-10-CM | POA: Diagnosis not present

## 2022-08-19 DIAGNOSIS — J3089 Other allergic rhinitis: Secondary | ICD-10-CM | POA: Diagnosis not present

## 2022-08-19 DIAGNOSIS — J3081 Allergic rhinitis due to animal (cat) (dog) hair and dander: Secondary | ICD-10-CM | POA: Diagnosis not present

## 2022-08-19 DIAGNOSIS — J301 Allergic rhinitis due to pollen: Secondary | ICD-10-CM | POA: Diagnosis not present

## 2022-08-26 DIAGNOSIS — J301 Allergic rhinitis due to pollen: Secondary | ICD-10-CM | POA: Diagnosis not present

## 2022-08-26 DIAGNOSIS — J3081 Allergic rhinitis due to animal (cat) (dog) hair and dander: Secondary | ICD-10-CM | POA: Diagnosis not present

## 2022-08-26 DIAGNOSIS — J3089 Other allergic rhinitis: Secondary | ICD-10-CM | POA: Diagnosis not present

## 2022-09-02 DIAGNOSIS — J3081 Allergic rhinitis due to animal (cat) (dog) hair and dander: Secondary | ICD-10-CM | POA: Diagnosis not present

## 2022-09-02 DIAGNOSIS — J3089 Other allergic rhinitis: Secondary | ICD-10-CM | POA: Diagnosis not present

## 2022-09-02 DIAGNOSIS — J301 Allergic rhinitis due to pollen: Secondary | ICD-10-CM | POA: Diagnosis not present

## 2022-09-06 ENCOUNTER — Encounter: Payer: Self-pay | Admitting: Medical-Surgical

## 2022-09-06 ENCOUNTER — Ambulatory Visit (INDEPENDENT_AMBULATORY_CARE_PROVIDER_SITE_OTHER): Payer: BC Managed Care – PPO | Admitting: Medical-Surgical

## 2022-09-06 VITALS — BP 125/68 | HR 59 | Resp 20 | Ht 63.0 in | Wt 122.5 lb

## 2022-09-06 DIAGNOSIS — Z0000 Encounter for general adult medical examination without abnormal findings: Secondary | ICD-10-CM

## 2022-09-06 DIAGNOSIS — Z13220 Encounter for screening for lipoid disorders: Secondary | ICD-10-CM | POA: Diagnosis not present

## 2022-09-06 NOTE — Progress Notes (Signed)
Complete physical exam  Patient: [name]   DOB: Jul 12, 1955   67 y.o. Female  MRN: [id number]  Subjective:    Chief Complaint  Patient presents with   Annual Exam   [name] is a 67 y.o. female who presents today for a complete physical exam. She reports consuming a general diet. The patient does not participate in regular exercise at present. She generally feels well. She reports sleeping fairly well. She does not have additional problems to discuss today.   Most recent fall risk assessment:    09/06/2022   10:36 AM  Fall Risk   Falls in the past year? 0  Number falls in past yr: 0  Injury with Fall? 0  Risk for fall due to : No Fall Risks  Follow up Falls evaluation completed     Most recent depression screenings:    09/06/2022   10:36 AM 09/02/2021    8:40 AM  PHQ 2/9 Scores  PHQ - 2 Score 0 0    Vision:Within last year, Dental: No current dental problems and Receives regular dental care, and STD: The patient denies history of sexually transmitted disease.    Patient Care Team: Christen Butter, NP as PCP - General (Nurse Practitioner)   Outpatient Medications Prior to Visit  Medication Sig   azelastine (ASTELIN) 0.1 % nasal spray Place 1 spray into both nostrils daily.   B Complex Vitamins (B COMPLEX PO) Take 1 tablet by mouth daily.   Cholecalciferol 50 MCG (2000 UT) TABS Take 2,000 Units by mouth daily.   EPINEPHrine 0.3 mg/0.3 mL IJ SOAJ injection See admin instructions.   fluticasone (FLONASE) 50 MCG/ACT nasal spray Place 1 spray into both nostrils daily.   levocetirizine (XYZAL) 5 MG tablet Take 1 tablet by mouth daily.   montelukast (SINGULAIR) 10 MG tablet Take 1 tablet by mouth daily.   pregabalin (LYRICA) 75 MG capsule Take 75 mg by mouth daily.   VIVELLE-DOT 0.025 MG/24HR Place 1 patch onto the skin 2 (two) times a week.   No facility-administered medications prior to visit.    Review of Systems  Constitutional:  Negative for chills, fever,  malaise/fatigue and weight loss.  HENT:  Negative for congestion, ear pain, hearing loss, sinus pain and sore throat.   Eyes:  Negative for blurred vision, photophobia and pain.  Respiratory:  Negative for cough, shortness of breath and wheezing.   Cardiovascular:  Negative for chest pain, palpitations and leg swelling.  Gastrointestinal:  Negative for abdominal pain, constipation, diarrhea, heartburn, nausea and vomiting.  Genitourinary:  Negative for dysuria, frequency and urgency.  Musculoskeletal:  Negative for falls and neck pain.  Skin:  Negative for itching and rash.  Neurological:  Negative for dizziness, weakness and headaches.  Endo/Heme/Allergies:  Negative for polydipsia. Does not bruise/bleed easily.  Psychiatric/Behavioral:  Negative for depression, substance abuse and suicidal ideas. The patient is not nervous/anxious and does not have insomnia.      Objective:    BP 125/68 (BP Location: Left Arm, Cuff Size: Normal)   Pulse (!) 59   Resp 20   Ht 5\' 3"  (1.6 m)   Wt 122 lb 8 oz (55.6 kg)   SpO2 98%   BMI 21.70 kg/m    Physical Exam Vitals reviewed.  Constitutional:      General: She is not in acute distress.    Appearance: Normal appearance. She is normal weight. She is not ill-appearing.  HENT:     Head: Normocephalic  and atraumatic.     Right Ear: Tympanic membrane, ear canal and external ear normal. There is no impacted cerumen.     Left Ear: Tympanic membrane, ear canal and external ear normal. There is no impacted cerumen.     Nose: Nose normal. No congestion or rhinorrhea.     Mouth/Throat:     Mouth: Mucous membranes are moist.     Pharynx: No oropharyngeal exudate or posterior oropharyngeal erythema.  Eyes:     General: No scleral icterus.       Right eye: No discharge.        Left eye: No discharge.     Extraocular Movements: Extraocular movements intact.     Conjunctiva/sclera: Conjunctivae normal.     Pupils: Pupils are equal, round, and reactive to  light.  Neck:     Thyroid: No thyromegaly.     Vascular: No carotid bruit or JVD.     Trachea: Trachea normal.  Cardiovascular:     Rate and Rhythm: Normal rate and regular rhythm.     Pulses: Normal pulses.     Heart sounds: Normal heart sounds. No murmur heard.    No friction rub. No gallop.  Pulmonary:     Effort: Pulmonary effort is normal. No respiratory distress.     Breath sounds: Normal breath sounds. No wheezing.  Abdominal:     General: Bowel sounds are normal. There is no distension.     Palpations: Abdomen is soft.     Tenderness: There is no abdominal tenderness. There is no guarding.  Musculoskeletal:        General: Normal range of motion.     Cervical back: Normal range of motion and neck supple.  Lymphadenopathy:     Cervical: No cervical adenopathy.  Skin:    General: Skin is warm and dry.  Neurological:     Mental Status: She is alert and oriented to person, place, and time.     Cranial Nerves: No cranial nerve deficit.  Psychiatric:        Mood and Affect: Mood normal.        Behavior: Behavior normal.        Thought Content: Thought content normal.        Judgment: Judgment normal.   No results found for any visits on 09/06/22.     Assessment & Plan:    Routine Health Maintenance and Physical Exam  Immunization History  Administered Date(s) Administered   Influenza, High Dose Seasonal PF 05/19/2020, 06/17/2021   Influenza,inj,Quad PF,6+ Mos 01/02/2021   Influenza-Unspecified 12/20/2017, 01/01/2022   PFIZER(Purple Top)SARS-COV-2 Vaccination 07/03/2020, 07/31/2020   Pneumococcal Conjugate-13 06/17/2017   Pneumococcal Polysaccharide-23 06/17/2021   Tdap 06/17/2017   Zoster Recombinat (Shingrix) 09/01/2020, 12/01/2020    Health Maintenance  Topic Date Due   COVID-19 Vaccine (3 - 2023-24 season) 09/22/2022 (Originally 11/20/2021)   INFLUENZA VACCINE  10/21/2022   MAMMOGRAM  10/23/2023   Pneumonia Vaccine 57+ Years old (3 of 3 - PPSV23 or PCV20)  06/18/2026   Colonoscopy  08/08/2026   DTaP/Tdap/Td (2 - Td or Tdap) 06/18/2027   DEXA SCAN  Completed   Hepatitis C Screening  Completed   HIV Screening  Completed   Zoster Vaccines- Shingrix  Completed   HPV VACCINES  Aged Out    Discussed health benefits of physical activity, and encouraged her to engage in regular exercise appropriate for her age and condition.  1. Annual physical exam Checking labs as below. UTD on preventative care.  Wellness information provided with AVS. - CBC with Differential/Platelet - COMPLETE METABOLIC PANEL WITH GFR - Lipid panel  2. Lipid screening Checking lipids. - Lipid panel  Return in about 1 year (around 09/06/2023) for annual physical exam or sooner if needed.   Christen Butter, NP

## 2022-09-07 LAB — CBC WITH DIFFERENTIAL/PLATELET
Absolute Monocytes: 487 cells/uL (ref 200–950)
Basophils Absolute: 87 cells/uL (ref 0–200)
Basophils Relative: 1 %
Eosinophils Absolute: 96 cells/uL (ref 15–500)
Eosinophils Relative: 1.1 %
HCT: 42.6 % (ref 35.0–45.0)
Hemoglobin: 14 g/dL (ref 11.7–15.5)
Lymphs Abs: 2888 cells/uL (ref 850–3900)
MCH: 28.7 pg (ref 27.0–33.0)
MCHC: 32.9 g/dL (ref 32.0–36.0)
MCV: 87.3 fL (ref 80.0–100.0)
MPV: 9.5 fL (ref 7.5–12.5)
Monocytes Relative: 5.6 %
Neutro Abs: 5142 cells/uL (ref 1500–7800)
Neutrophils Relative %: 59.1 %
Platelets: 334 10*3/uL (ref 140–400)
RBC: 4.88 10*6/uL (ref 3.80–5.10)
RDW: 11.7 % (ref 11.0–15.0)
Total Lymphocyte: 33.2 %
WBC: 8.7 10*3/uL (ref 3.8–10.8)

## 2022-09-07 LAB — COMPLETE METABOLIC PANEL WITH GFR
AG Ratio: 1.5 (calc) (ref 1.0–2.5)
ALT: 11 U/L (ref 6–29)
AST: 17 U/L (ref 10–35)
Albumin: 4.4 g/dL (ref 3.6–5.1)
Alkaline phosphatase (APISO): 53 U/L (ref 37–153)
BUN: 13 mg/dL (ref 7–25)
CO2: 28 mmol/L (ref 20–32)
Calcium: 10 mg/dL (ref 8.6–10.4)
Chloride: 105 mmol/L (ref 98–110)
Creat: 0.78 mg/dL (ref 0.50–1.05)
Globulin: 3 g/dL (calc) (ref 1.9–3.7)
Glucose, Bld: 93 mg/dL (ref 65–99)
Potassium: 4.4 mmol/L (ref 3.5–5.3)
Sodium: 141 mmol/L (ref 135–146)
Total Bilirubin: 0.7 mg/dL (ref 0.2–1.2)
Total Protein: 7.4 g/dL (ref 6.1–8.1)
eGFR: 84 mL/min/{1.73_m2} (ref >=60)

## 2022-09-07 LAB — LIPID PANEL
Cholesterol: 238 mg/dL — ABNORMAL HIGH (ref <200)
HDL: 74 mg/dL (ref >=50)
LDL Cholesterol (Calc): 144 mg/dL (calc) — ABNORMAL HIGH
Non-HDL Cholesterol (Calc): 164 mg/dL (calc) — ABNORMAL HIGH (ref <130)
Total CHOL/HDL Ratio: 3.2 (calc) (ref <5.0)
Triglycerides: 93 mg/dL (ref <150)

## 2022-09-16 ENCOUNTER — Other Ambulatory Visit: Payer: Self-pay | Admitting: Medical-Surgical

## 2022-09-16 DIAGNOSIS — J3081 Allergic rhinitis due to animal (cat) (dog) hair and dander: Secondary | ICD-10-CM | POA: Diagnosis not present

## 2022-09-16 DIAGNOSIS — J301 Allergic rhinitis due to pollen: Secondary | ICD-10-CM | POA: Diagnosis not present

## 2022-09-16 DIAGNOSIS — Z1231 Encounter for screening mammogram for malignant neoplasm of breast: Secondary | ICD-10-CM

## 2022-09-16 DIAGNOSIS — J3089 Other allergic rhinitis: Secondary | ICD-10-CM | POA: Diagnosis not present

## 2022-09-30 DIAGNOSIS — J3081 Allergic rhinitis due to animal (cat) (dog) hair and dander: Secondary | ICD-10-CM | POA: Diagnosis not present

## 2022-09-30 DIAGNOSIS — J3089 Other allergic rhinitis: Secondary | ICD-10-CM | POA: Diagnosis not present

## 2022-09-30 DIAGNOSIS — J301 Allergic rhinitis due to pollen: Secondary | ICD-10-CM | POA: Diagnosis not present

## 2022-10-14 DIAGNOSIS — J3089 Other allergic rhinitis: Secondary | ICD-10-CM | POA: Diagnosis not present

## 2022-10-14 DIAGNOSIS — J301 Allergic rhinitis due to pollen: Secondary | ICD-10-CM | POA: Diagnosis not present

## 2022-10-14 DIAGNOSIS — J3081 Allergic rhinitis due to animal (cat) (dog) hair and dander: Secondary | ICD-10-CM | POA: Diagnosis not present

## 2022-10-18 DIAGNOSIS — U071 COVID-19: Secondary | ICD-10-CM | POA: Diagnosis not present

## 2022-10-18 DIAGNOSIS — J029 Acute pharyngitis, unspecified: Secondary | ICD-10-CM | POA: Diagnosis not present

## 2022-10-28 DIAGNOSIS — J301 Allergic rhinitis due to pollen: Secondary | ICD-10-CM | POA: Diagnosis not present

## 2022-10-28 DIAGNOSIS — J3089 Other allergic rhinitis: Secondary | ICD-10-CM | POA: Diagnosis not present

## 2022-10-28 DIAGNOSIS — J3081 Allergic rhinitis due to animal (cat) (dog) hair and dander: Secondary | ICD-10-CM | POA: Diagnosis not present

## 2022-10-29 ENCOUNTER — Ambulatory Visit
Admission: RE | Admit: 2022-10-29 | Discharge: 2022-10-29 | Disposition: A | Discharge status: Home / Self Care | Payer: BC Managed Care – PPO | Source: Ambulatory Visit | Attending: Medical-Surgical | Admitting: Medical-Surgical

## 2022-10-29 DIAGNOSIS — Z1231 Encounter for screening mammogram for malignant neoplasm of breast: Secondary | ICD-10-CM | POA: Diagnosis not present

## 2022-11-03 ENCOUNTER — Other Ambulatory Visit: Payer: Self-pay | Admitting: Medical-Surgical

## 2022-11-03 DIAGNOSIS — R928 Other abnormal and inconclusive findings on diagnostic imaging of breast: Secondary | ICD-10-CM

## 2022-11-11 DIAGNOSIS — J3081 Allergic rhinitis due to animal (cat) (dog) hair and dander: Secondary | ICD-10-CM | POA: Diagnosis not present

## 2022-11-11 DIAGNOSIS — J301 Allergic rhinitis due to pollen: Secondary | ICD-10-CM | POA: Diagnosis not present

## 2022-11-11 DIAGNOSIS — J3089 Other allergic rhinitis: Secondary | ICD-10-CM | POA: Diagnosis not present

## 2022-11-12 ENCOUNTER — Ambulatory Visit
Admission: RE | Admit: 2022-11-12 | Discharge: 2022-11-12 | Disposition: A | Discharge status: Home / Self Care | Payer: BC Managed Care – PPO | Source: Ambulatory Visit | Attending: Medical-Surgical | Admitting: Medical-Surgical

## 2022-11-12 DIAGNOSIS — R928 Other abnormal and inconclusive findings on diagnostic imaging of breast: Secondary | ICD-10-CM | POA: Diagnosis not present

## 2022-11-25 DIAGNOSIS — J301 Allergic rhinitis due to pollen: Secondary | ICD-10-CM | POA: Diagnosis not present

## 2022-11-25 DIAGNOSIS — J3089 Other allergic rhinitis: Secondary | ICD-10-CM | POA: Diagnosis not present

## 2022-11-25 DIAGNOSIS — J3081 Allergic rhinitis due to animal (cat) (dog) hair and dander: Secondary | ICD-10-CM | POA: Diagnosis not present

## 2022-12-09 DIAGNOSIS — J3081 Allergic rhinitis due to animal (cat) (dog) hair and dander: Secondary | ICD-10-CM | POA: Diagnosis not present

## 2022-12-09 DIAGNOSIS — J3089 Other allergic rhinitis: Secondary | ICD-10-CM | POA: Diagnosis not present

## 2022-12-09 DIAGNOSIS — J301 Allergic rhinitis due to pollen: Secondary | ICD-10-CM | POA: Diagnosis not present

## 2022-12-14 DIAGNOSIS — J3081 Allergic rhinitis due to animal (cat) (dog) hair and dander: Secondary | ICD-10-CM | POA: Diagnosis not present

## 2022-12-14 DIAGNOSIS — J3089 Other allergic rhinitis: Secondary | ICD-10-CM | POA: Diagnosis not present

## 2022-12-14 DIAGNOSIS — J301 Allergic rhinitis due to pollen: Secondary | ICD-10-CM | POA: Diagnosis not present

## 2022-12-23 DIAGNOSIS — J301 Allergic rhinitis due to pollen: Secondary | ICD-10-CM | POA: Diagnosis not present

## 2022-12-23 DIAGNOSIS — J3081 Allergic rhinitis due to animal (cat) (dog) hair and dander: Secondary | ICD-10-CM | POA: Diagnosis not present

## 2022-12-23 DIAGNOSIS — J3089 Other allergic rhinitis: Secondary | ICD-10-CM | POA: Diagnosis not present

## 2023-01-06 DIAGNOSIS — J3089 Other allergic rhinitis: Secondary | ICD-10-CM | POA: Diagnosis not present

## 2023-01-06 DIAGNOSIS — J301 Allergic rhinitis due to pollen: Secondary | ICD-10-CM | POA: Diagnosis not present

## 2023-01-06 DIAGNOSIS — J3081 Allergic rhinitis due to animal (cat) (dog) hair and dander: Secondary | ICD-10-CM | POA: Diagnosis not present

## 2023-01-19 DIAGNOSIS — J301 Allergic rhinitis due to pollen: Secondary | ICD-10-CM | POA: Diagnosis not present

## 2023-01-19 DIAGNOSIS — J3089 Other allergic rhinitis: Secondary | ICD-10-CM | POA: Diagnosis not present

## 2023-01-19 DIAGNOSIS — J3081 Allergic rhinitis due to animal (cat) (dog) hair and dander: Secondary | ICD-10-CM | POA: Diagnosis not present

## 2023-01-27 DIAGNOSIS — J3089 Other allergic rhinitis: Secondary | ICD-10-CM | POA: Diagnosis not present

## 2023-01-27 DIAGNOSIS — J301 Allergic rhinitis due to pollen: Secondary | ICD-10-CM | POA: Diagnosis not present

## 2023-01-27 DIAGNOSIS — J3081 Allergic rhinitis due to animal (cat) (dog) hair and dander: Secondary | ICD-10-CM | POA: Diagnosis not present

## 2023-02-01 DIAGNOSIS — J301 Allergic rhinitis due to pollen: Secondary | ICD-10-CM | POA: Diagnosis not present

## 2023-02-01 DIAGNOSIS — J3081 Allergic rhinitis due to animal (cat) (dog) hair and dander: Secondary | ICD-10-CM | POA: Diagnosis not present

## 2023-02-01 DIAGNOSIS — J3089 Other allergic rhinitis: Secondary | ICD-10-CM | POA: Diagnosis not present

## 2023-02-08 DIAGNOSIS — J3089 Other allergic rhinitis: Secondary | ICD-10-CM | POA: Diagnosis not present

## 2023-02-08 DIAGNOSIS — J301 Allergic rhinitis due to pollen: Secondary | ICD-10-CM | POA: Diagnosis not present

## 2023-02-08 DIAGNOSIS — J3081 Allergic rhinitis due to animal (cat) (dog) hair and dander: Secondary | ICD-10-CM | POA: Diagnosis not present

## 2023-02-10 ENCOUNTER — Encounter: Payer: Self-pay | Admitting: Medical-Surgical

## 2023-02-10 ENCOUNTER — Ambulatory Visit: Payer: BC Managed Care – PPO | Admitting: Medical-Surgical

## 2023-02-10 VITALS — BP 124/71 | HR 74 | Resp 20 | Ht 63.0 in | Wt 123.4 lb

## 2023-02-10 DIAGNOSIS — E7800 Pure hypercholesterolemia, unspecified: Secondary | ICD-10-CM

## 2023-02-10 DIAGNOSIS — H6503 Acute serous otitis media, bilateral: Secondary | ICD-10-CM | POA: Diagnosis not present

## 2023-02-10 MED ORDER — PREDNISONE 20 MG PO TABS: 20.0000 mg | ORAL_TABLET | Freq: Every day | ORAL | 0 refills | Status: DC

## 2023-02-10 MED ORDER — CEFDINIR 300 MG PO CAPS: 300.0000 mg | ORAL_CAPSULE | Freq: Two times a day (BID) | ORAL | 0 refills | Status: DC

## 2023-02-10 MED ORDER — FLUCONAZOLE 150 MG PO TABS: 150.0000 mg | ORAL_TABLET | Freq: Once | ORAL | 0 refills | Status: AC

## 2023-02-10 NOTE — Progress Notes (Signed)
        Established patient visit  History, exam, impression, and plan:  1. Non-recurrent acute serous otitis media of both ears Pleasant 67 year old female presenting today with complaints of bilateral ear burning, pain, and intermittent hearing loss for the last 3 weeks.  Has been trying to treat this at home and just mottle through but has not had any relief and the symptoms are getting worse.  No recent upper respiratory illnesses or significant sinus congestion.  Does have a history of allergies.  Using Flonase/Astelin nasal sprays, taking Singulair, and taking Xyzal daily.  Was taking Advil as needed for relief.  On examination, both ears show canal erythema along with mild bulging of the tympanic membrane and a slightly cloudy effusion.  No cervical lymphadenopathy noted.  Suspect eustachian tube dysfunction related to allergy exacerbation that has now turned into bilateral otitis media.  Treating with Omnicef 300 mg twice daily.  Continue all current medications.  Okay to use ibuprofen or Tylenol for discomfort.  As we are going into a weekend, I have sent a prednisone burst for her should she not start to see significant relief with antibiotics alone after 72 hours or more.  Sending Diflucan x 1 tablet for VVC prophylaxis. - cefdinir (OMNICEF) 300 MG capsule; Take 1 capsule (300 mg total) by mouth 2 (two) times daily.  Dispense: 14 capsule; Refill: 0  2. Pure hypercholesterolemia She does have a history of elevated LDL and total cholesterol.  It has been elevated for several years and her risk score was at 5.2%.  I had recommended lifestyle modifications and dietary habit changes along with consideration of possible treatment with a low-dose statin medication.  Today she has concerns that she should consider a statin medication given that her cholesterol will be checked again in June and will likely be even higher.  We discussed the risk versus benefits of statin therapy and she is still on the  fence about starting 1.  Worried about side effects.  Discussed risk stratification with CT coronary calcium scoring.  Patient is agreeable so ordering today.  She is aware of the $99 flat fee in our radiology department downstairs. - CT CARDIAC SCORING (SELF PAY ONLY); Future  Procedures performed this visit: None.  Return if symptoms worsen or fail to improve.  __________________________________ Thayer Ohm, DNP, APRN, FNP-BC Primary Care and Sports Medicine [facility] [place]

## 2023-02-11 ENCOUNTER — Telehealth: Payer: Self-pay

## 2023-02-11 MED ORDER — AZITHROMYCIN 250 MG PO TABS: ORAL_TABLET | ORAL | 0 refills | Status: DC

## 2023-02-11 MED ORDER — AZITHROMYCIN 250 MG PO TABS
ORAL_TABLET | ORAL | 0 refills | Status: AC
Start: 2023-02-11 — End: 2023-02-16

## 2023-02-11 NOTE — Telephone Encounter (Signed)
Copied from CRM [id number]. Topic: Clinical - Prescription Issue >> Feb 11, 2023  2:26 PM Conni Elliot wrote: Reason for CRM: pt rx (Azithromycin) was sent to wrong pharmacy. Updated pt pharmacy on file and pt needs rx re sent out   This has been changed to the correct pharmacy.

## 2023-02-11 NOTE — Telephone Encounter (Signed)
Patient advised. She wanted the prescription to go to [facility] on [address].

## 2023-02-11 NOTE — Telephone Encounter (Signed)
Copied from CRM [id number]. Topic: Clinical - Medication Question >> Feb 11, 2023  8:18 AM Herbert Seta B wrote: Reason for CRM: cefdinir (OMNICEF) 300 MG capsule Patient was prescribed antibiotic yesterday, is experiencing diarrhea, would like to take something else if possible

## 2023-02-11 NOTE — Telephone Encounter (Signed)
Discontinue Cefdinir. I sent Azithromycin to the pharmacy for her.   ___________________________________________ Thayer Ohm, DNP, APRN, FNP-BC Primary Care and Sports Medicine [facility] [place]

## 2023-02-11 NOTE — Addendum Note (Signed)
Addended by: Chalmers Cater on: 02/11/2023 02:29 PM   Modules accepted: Orders

## 2023-02-15 DIAGNOSIS — J301 Allergic rhinitis due to pollen: Secondary | ICD-10-CM | POA: Diagnosis not present

## 2023-02-15 DIAGNOSIS — J3089 Other allergic rhinitis: Secondary | ICD-10-CM | POA: Diagnosis not present

## 2023-02-22 DIAGNOSIS — J3089 Other allergic rhinitis: Secondary | ICD-10-CM | POA: Diagnosis not present

## 2023-02-22 DIAGNOSIS — J301 Allergic rhinitis due to pollen: Secondary | ICD-10-CM | POA: Diagnosis not present

## 2023-02-22 DIAGNOSIS — J3081 Allergic rhinitis due to animal (cat) (dog) hair and dander: Secondary | ICD-10-CM | POA: Diagnosis not present

## 2023-02-25 ENCOUNTER — Ambulatory Visit (INDEPENDENT_AMBULATORY_CARE_PROVIDER_SITE_OTHER): Payer: Self-pay

## 2023-02-25 ENCOUNTER — Other Ambulatory Visit: Payer: BC Managed Care – PPO

## 2023-02-25 DIAGNOSIS — E7800 Pure hypercholesterolemia, unspecified: Secondary | ICD-10-CM

## 2023-02-26 ENCOUNTER — Encounter: Payer: Self-pay | Admitting: Medical-Surgical

## 2023-02-26 DIAGNOSIS — E7800 Pure hypercholesterolemia, unspecified: Secondary | ICD-10-CM

## 2023-02-26 DIAGNOSIS — I700 Atherosclerosis of aorta: Secondary | ICD-10-CM | POA: Insufficient documentation

## 2023-02-28 MED ORDER — ATORVASTATIN CALCIUM 10 MG PO TABS: 10.0000 mg | ORAL_TABLET | Freq: Every day | ORAL | 3 refills | Status: DC

## 2023-03-01 DIAGNOSIS — J3089 Other allergic rhinitis: Secondary | ICD-10-CM | POA: Diagnosis not present

## 2023-03-01 DIAGNOSIS — J3081 Allergic rhinitis due to animal (cat) (dog) hair and dander: Secondary | ICD-10-CM | POA: Diagnosis not present

## 2023-03-01 DIAGNOSIS — J301 Allergic rhinitis due to pollen: Secondary | ICD-10-CM | POA: Diagnosis not present

## 2023-03-02 ENCOUNTER — Telehealth: Payer: Self-pay | Admitting: Medical-Surgical

## 2023-03-02 NOTE — Telephone Encounter (Signed)
Copied from CRM [id number]. Topic: General - Other >> Feb 28, 2023  1:56 PM Sasha H wrote: Reason for CRM: pt wants to start a statin   MyChart message sent to patient on 02/28/2023 with update on starting a statin medication. Lipitor 10mg  daily was sent to the pharmacy on file on 12/9. She was instructed to return for a lab drawn after 6-8 weeks to evaluate tolerance and response to the medication.   ___________________________________________ Thayer Ohm, DNP, APRN, FNP-BC Primary Care and Sports Medicine [facility] [place]

## 2023-03-08 DIAGNOSIS — J3089 Other allergic rhinitis: Secondary | ICD-10-CM | POA: Diagnosis not present

## 2023-03-08 DIAGNOSIS — J3081 Allergic rhinitis due to animal (cat) (dog) hair and dander: Secondary | ICD-10-CM | POA: Diagnosis not present

## 2023-03-08 DIAGNOSIS — J301 Allergic rhinitis due to pollen: Secondary | ICD-10-CM | POA: Diagnosis not present

## 2023-03-18 DIAGNOSIS — J301 Allergic rhinitis due to pollen: Secondary | ICD-10-CM | POA: Diagnosis not present

## 2023-03-18 DIAGNOSIS — J3089 Other allergic rhinitis: Secondary | ICD-10-CM | POA: Diagnosis not present

## 2023-03-18 DIAGNOSIS — J3081 Allergic rhinitis due to animal (cat) (dog) hair and dander: Secondary | ICD-10-CM | POA: Diagnosis not present

## 2023-03-19 ENCOUNTER — Other Ambulatory Visit: Payer: Self-pay | Admitting: Medical-Surgical

## 2023-03-21 DIAGNOSIS — J3081 Allergic rhinitis due to animal (cat) (dog) hair and dander: Secondary | ICD-10-CM | POA: Diagnosis not present

## 2023-03-21 DIAGNOSIS — J301 Allergic rhinitis due to pollen: Secondary | ICD-10-CM | POA: Diagnosis not present

## 2023-03-22 DIAGNOSIS — J3089 Other allergic rhinitis: Secondary | ICD-10-CM | POA: Diagnosis not present

## 2023-04-01 DIAGNOSIS — J301 Allergic rhinitis due to pollen: Secondary | ICD-10-CM | POA: Diagnosis not present

## 2023-04-01 DIAGNOSIS — J3089 Other allergic rhinitis: Secondary | ICD-10-CM | POA: Diagnosis not present

## 2023-04-01 DIAGNOSIS — J3081 Allergic rhinitis due to animal (cat) (dog) hair and dander: Secondary | ICD-10-CM | POA: Diagnosis not present

## 2023-04-15 DIAGNOSIS — J301 Allergic rhinitis due to pollen: Secondary | ICD-10-CM | POA: Diagnosis not present

## 2023-04-15 DIAGNOSIS — J3089 Other allergic rhinitis: Secondary | ICD-10-CM | POA: Diagnosis not present

## 2023-04-15 DIAGNOSIS — J3081 Allergic rhinitis due to animal (cat) (dog) hair and dander: Secondary | ICD-10-CM | POA: Diagnosis not present

## 2023-04-25 DIAGNOSIS — E7800 Pure hypercholesterolemia, unspecified: Secondary | ICD-10-CM | POA: Diagnosis not present

## 2023-04-26 ENCOUNTER — Encounter: Payer: Self-pay | Admitting: Medical-Surgical

## 2023-04-26 LAB — HEPATIC FUNCTION PANEL
ALT: 18 [IU]/L (ref 0–32)
AST: 19 [IU]/L (ref 0–40)
Albumin: 4.2 g/dL (ref 3.9–4.9)
Alkaline Phosphatase: 60 [IU]/L (ref 44–121)
Bilirubin Total: 0.5 mg/dL (ref 0.0–1.2)
Bilirubin, Direct: 0.16 mg/dL (ref 0.00–0.40)
Total Protein: 6.7 g/dL (ref 6.0–8.5)

## 2023-04-26 LAB — LIPID PANEL
Chol/HDL Ratio: 2.2 {ratio} (ref 0.0–4.4)
Cholesterol, Total: 173 mg/dL (ref 100–199)
HDL: 77 mg/dL (ref >39)
LDL Chol Calc (NIH): 84 mg/dL (ref 0–99)
Triglycerides: 59 mg/dL (ref 0–149)
VLDL Cholesterol Cal: 12 mg/dL (ref 5–40)

## 2023-04-29 DIAGNOSIS — J301 Allergic rhinitis due to pollen: Secondary | ICD-10-CM | POA: Diagnosis not present

## 2023-04-29 DIAGNOSIS — J3081 Allergic rhinitis due to animal (cat) (dog) hair and dander: Secondary | ICD-10-CM | POA: Diagnosis not present

## 2023-04-29 DIAGNOSIS — J3089 Other allergic rhinitis: Secondary | ICD-10-CM | POA: Diagnosis not present

## 2023-05-02 ENCOUNTER — Ambulatory Visit: Payer: Self-pay | Admitting: Medical-Surgical

## 2023-05-02 NOTE — Telephone Encounter (Signed)
  Chief Complaint: Ear Pain Symptoms: Pressure, Pain Frequency: Acute Pertinent Negatives: Patient denies fever, drainage, mild hearing loss Disposition: [] ED /[] Urgent Care (no appt availability in office) / [x] Appointment(In office/virtual)/ []  [place] [facility]/ [] Home Care/ [] Refused Recommended Disposition /[] [facility]/ []  Follow-up with PCP Additional Notes: TB is being triaged for left ear pain. Symptoms include pressure, hearing loss. Discussed symptoms, severity, and duration. Based on assessment, patient was advised to see PCP. Patient verbalized understanding and agreement with plan. Documentation provided.     Reason for Disposition  Earache  (Exceptions: brief ear pain of < 60 minutes duration, earache occurring during air travel  Answer Assessment - Initial Assessment Questions 1. LOCATION: "Which ear is involved?"     Left Ear  2. ONSET: "When did the ear start hurting"      Over the weekend  3. SEVERITY: "How bad is the pain?"  (Scale 1-10; mild, moderate or severe)   - MILD (1-3): doesn't interfere with normal activities    - MODERATE (4-7): interferes with normal activities or awakens from sleep    - SEVERE (8-10): excruciating pain, unable to do any normal activities      3  4. URI SYMPTOMS: "Do you have a runny nose or cough?"     No  5. FEVER: "Do you have a fever?" If Yes, ask: "What is your temperature, how was it measured, and when did it start?"     No  6. CAUSE: "Have you been swimming recently?", "How often do you use Q-TIPS?", "Have you had any recent air travel or scuba diving?"     No  7. OTHER SYMPTOMS: "Do you have any other symptoms?" (e.g., headache, stiff neck, dizziness, vomiting, runny nose, decreased hearing)     No  8. PREGNANCY: "Is there any chance you are pregnant?" "When was your last menstrual period?"     No  Protocols used: Louie Rover

## 2023-05-03 ENCOUNTER — Ambulatory Visit: Payer: BC Managed Care – PPO | Admitting: Medical-Surgical

## 2023-05-03 VITALS — BP 118/68 | HR 80 | Resp 20 | Ht 63.0 in | Wt 126.6 lb

## 2023-05-03 DIAGNOSIS — H9202 Otalgia, left ear: Secondary | ICD-10-CM | POA: Diagnosis not present

## 2023-05-03 MED ORDER — METHYLPREDNISOLONE 4 MG PO TBPK: ORAL_TABLET | ORAL | 0 refills | Status: DC

## 2023-05-03 NOTE — Progress Notes (Unsigned)
Established patient visit  History, exam, impression, and plan:  No problem-specific Assessment & Plan notes found for this encounter.   ROS  Physical Exam  Procedures performed this visit: None.  No follow-ups on file.  __________________________________ Thayer Ohm, DNP, APRN, FNP-BC Primary Care and Sports Medicine [facility]

## 2023-05-03 NOTE — Progress Notes (Unsigned)
Subjective:  Patient ID: [name], female    DOB: 01/08/1956, 68 y.o.   MRN: [id number]  Patient Care Team: Christen Butter, NP as PCP - General (Nurse Practitioner)   Chief Complaint:  Ear Pain (LEFT)   HPI:  [name] is a 68 y.o. female presenting on 05/03/2023 for Ear Pain (LEFT)   History, Exam,  Impression and Plan HPI  1. Ear pain, left (Primary) Pleasant elderly female presents for left ear hearing loss that progressed to ear pain over the last 6 days.  Patient has history of eustachian tube dysfunction and otitis media with last episode November 2024 requiring antibiotics and steroids.  Patient is being followed by allergist who manages Astelin, Flonase, Xyzal, and Singulair.  Initially on 04/29/2023 ear felt stuffy with reduced hearing.  Symptoms progressively worsened with subjective loss of hearing and sensation of fullness and ear pain.  Patient rates pain as 3/10 dull and stabbing at this time.  Reports that yesterday here was burning with a full feeling with severe loss of hearing.  Patient states it feels like her ear needs to pop.  Patient denies any drainage or any pain moving auricle or pushing on tragus bilaterally. On exam negative tenderness or grimace with manipulation of auricle or pushing on tragus bilaterally.  External ear canal without edema or erythema bilaterally.  Left TM pearly gray with light reflex shifted upwards to 8-9 o'clock.  Slight erythemic injection at 12-3 o'clock on edge of left TM.  Right TM clear with light reflex at 5:00 and with negative erythema.  Right TM is pearly gray and intact.   -     methylPREDNISolone (MEDROL DOSEPAK) 4 MG TBPK tablet; Take as directed.  Continue all other maintenance medications. Pt to discuss steroids with allergist regarding next biweekly allergy shot. Last shot was 04/29/2023.  Follow up plan: Return in about 5 days (around 05/08/2023), or if symptoms worsen or fail to improve with completion of oral  steroids.   Relevant past medical, surgical, family, and social history reviewed and updated as indicated.  Allergies and medications reviewed and updated. Data reviewed: Chart in Epic.   Past Medical History:  Diagnosis Date   Abnormal mammogram    Allergic rhinitis    Chronic cystitis    Colon polyps    Elevated cholesterol    Hemorrhoids    Low bone mass    Prolapsed uterus    RLQ abdominal pain    OVARIAN CYST   Thyroid nodule    Vitamin D deficiency    Vulvodynia     Past Surgical History:  Procedure Laterality Date   ABDOMINAL HYSTERECTOMY  03/23/1987   VAGINAL    APPENDECTOMY  05/02/2008   BY LAPAROSCOPY   CARDIOVASCULAR STRESS TEST  02/20/2011   10 min, 11 METS; No ishcemia / no Infarction;, METS = 11Peak HR 180 bpm;; Ecellent Exercise Tolerance   CESAREAN SECTION     PELVIC LAPAROSCOPY  05/02/2008   RIGHT SALPINGO-OOPHORECTOMY AND EXCISION  OF CYSTIC RLQ ADHESIONS   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/1999   TOTAL ABDOMINAL HYSTERECTOMY     TUBAL LIGATION      Social History   Socioeconomic History   Marital status: Widowed    Spouse name: Not on file   Number of children: Not on file   Years of education: Not on file   Highest education level: 12th grade  Occupational History   Not on file  Tobacco Use  Smoking status: Never   Smokeless tobacco: Never  Vaping Use   Vaping status: Never Used  Substance and Sexual Activity   Alcohol use: No   Drug use: No   Sexual activity: Not Currently  Other Topics Concern   Not on file  Social History Narrative   She is a married, mother of 3 ratio exercise routinely walking her dog. She does not smoke or drink. Quit smoking in 2000. She works at [employer] office ([employer])   Social Drivers of Health   Financial Resource Strain: Low Risk  (05/03/2023)   Overall Financial Resource Strain (CARDIA)    Difficulty of Paying Living Expenses: Not hard at all  Food Insecurity: No Food Insecurity (05/03/2023)    Hunger Vital Sign    Worried About Running Out of Food in the Last Year: Never true    Ran Out of Food in the Last Year: Never true  Transportation Needs: No Transportation Needs (05/03/2023)   PRAPARE - Administrator, Civil Service (Medical): No    Lack of Transportation (Non-Medical): No  Physical Activity: Insufficiently Active (05/03/2023)   Exercise Vital Sign    Days of Exercise per Week: 1 day    Minutes of Exercise per Session: 20 min  Stress: No Stress Concern Present (05/03/2023)   [employer] of Occupational Health - Occupational Stress Questionnaire    Feeling of Stress : Not at all  Social Connections: Socially Isolated (05/03/2023)   Social Connection and Isolation Panel [NHANES]    Frequency of Communication with Friends and Family: More than three times a week    Frequency of Social Gatherings with Friends and Family: Once a week    Attends Religious Services: Never    Database administrator or Organizations: No    Attends Engineer, structural: Not on file    Marital Status: Widowed  Intimate Partner Violence: Unknown (10/18/2022)   Received from [facility]   HITS    Physically Hurt: Not on file    Insult or Talk Down To: Not on file    Threaten Physical Harm: Not on file    Scream or Curse: Not on file    Outpatient Encounter Medications as of 05/03/2023  Medication Sig   atorvastatin (LIPITOR) 10 MG tablet Take 1 tablet (10 mg total) by mouth daily.   azelastine (ASTELIN) 0.1 % nasal spray Place 1 spray into both nostrils daily.   B Complex Vitamins (B COMPLEX PO) Take 1 tablet by mouth daily.   Cholecalciferol 50 MCG (2000 UT) TABS Take 2,000 Units by mouth daily.   EPINEPHrine 0.3 mg/0.3 mL IJ SOAJ injection See admin instructions.   fluticasone (FLONASE) 50 MCG/ACT nasal spray Place 1 spray into both nostrils daily.   levocetirizine (XYZAL) 5 MG tablet Take 1 tablet by mouth daily.   methylPREDNISolone (MEDROL DOSEPAK) 4 MG TBPK  tablet Take as directed.   montelukast (SINGULAIR) 10 MG tablet Take 1 tablet by mouth daily.   pregabalin (LYRICA) 75 MG capsule Take 75 mg by mouth daily.   VIVELLE-DOT 0.025 MG/24HR Place 1 patch onto the skin 2 (two) times a week.   [DISCONTINUED] cefdinir (OMNICEF) 300 MG capsule Take 1 capsule (300 mg total) by mouth 2 (two) times daily.   [DISCONTINUED] predniSONE (DELTASONE) 20 MG tablet Take 1 tablet (20 mg total) by mouth daily with breakfast.   No facility-administered encounter medications on file as of 05/03/2023.    Allergies  Allergen Reactions  Ciprofloxacin Rash   Omnicef [Cefdinir] Diarrhea    Review of Systems      Objective:  BP 118/68 (BP Location: Left Arm, Cuff Size: Normal)   Pulse 80   Resp 20   Ht 5\' 3"  (1.6 m)   Wt 126 lb 9.6 oz (57.4 kg)   SpO2 100%   BMI 22.43 kg/m    Wt Readings from Last 3 Encounters:  05/03/23 126 lb 9.6 oz (57.4 kg)  02/10/23 123 lb 6.4 oz (56 kg)  09/06/22 122 lb 8 oz (55.6 kg)    Physical Exam  Results for orders placed or performed in visit on 02/26/23  Lipid panel   Collection Time: 04/25/23  8:39 AM  Result Value Ref Range   Cholesterol, Total 173 100 - 199 mg/dL   Triglycerides 59 0 - 149 mg/dL   HDL 77 >56 mg/dL   VLDL Cholesterol Cal 12 5 - 40 mg/dL   LDL Chol Calc (NIH) 84 0 - 99 mg/dL   Chol/HDL Ratio 2.2 0.0 - 4.4 ratio  Hepatic function panel   Collection Time: 04/25/23  8:39 AM  Result Value Ref Range   Total Protein 6.7 6.0 - 8.5 g/dL   Albumin 4.2 3.9 - 4.9 g/dL   Bilirubin Total 0.5 0.0 - 1.2 mg/dL   Bilirubin, Direct 2.13 0.00 - 0.40 mg/dL   Alkaline Phosphatase 60 44 - 121 IU/L   AST 19 0 - 40 IU/L   ALT 18 0 - 32 IU/L       Pertinent labs & imaging results that were available during my care of the patient were reviewed by me and considered in my medical decision making.   Continue healthy lifestyle choices, including diet (rich in fruits, vegetables, and lean proteins, and low in salt  and simple carbohydrates) and exercise (at least 30 minutes of moderate physical activity daily).  The above assessment and management plan was discussed with the patient. The patient verbalized understanding of and has agreed to the management plan. Patient is aware to call the clinic if they develop any new symptoms or if symptoms persist or worsen. Patient is aware when to return to the clinic for a follow-up visit. Patient educated on when it is appropriate to go to the emergency department.   Maryelizabeth Kaufmann Student AGNP

## 2023-05-04 ENCOUNTER — Encounter: Payer: Self-pay | Admitting: Medical-Surgical

## 2023-05-04 NOTE — Progress Notes (Signed)
Medical screening examination/treatment was performed by qualified clinical staff member and as supervising provider I was immediately available for consultation/collaboration. I have reviewed documentation and agree with assessment and plan.  Chronic history of allergies and eustachian tube dysfunction. Already on multiple agents for management. Recommend increasing Flonase to twice daily for the next week. No overt signs of infection at this time. Treating with Medrol Dosepak for acute inflammation secondary to allergies.   Thayer Ohm, DNP, APRN, FNP-BC [facility] and Sports Medicine

## 2023-05-13 DIAGNOSIS — J3081 Allergic rhinitis due to animal (cat) (dog) hair and dander: Secondary | ICD-10-CM | POA: Diagnosis not present

## 2023-05-13 DIAGNOSIS — J3089 Other allergic rhinitis: Secondary | ICD-10-CM | POA: Diagnosis not present

## 2023-05-13 DIAGNOSIS — J301 Allergic rhinitis due to pollen: Secondary | ICD-10-CM | POA: Diagnosis not present

## 2023-05-17 DIAGNOSIS — N951 Menopausal and female climacteric states: Secondary | ICD-10-CM | POA: Diagnosis not present

## 2023-05-17 DIAGNOSIS — N9089 Other specified noninflammatory disorders of vulva and perineum: Secondary | ICD-10-CM | POA: Diagnosis not present

## 2023-05-27 DIAGNOSIS — J3081 Allergic rhinitis due to animal (cat) (dog) hair and dander: Secondary | ICD-10-CM | POA: Diagnosis not present

## 2023-05-27 DIAGNOSIS — J3089 Other allergic rhinitis: Secondary | ICD-10-CM | POA: Diagnosis not present

## 2023-05-27 DIAGNOSIS — J301 Allergic rhinitis due to pollen: Secondary | ICD-10-CM | POA: Diagnosis not present

## 2023-06-10 DIAGNOSIS — J301 Allergic rhinitis due to pollen: Secondary | ICD-10-CM | POA: Diagnosis not present

## 2023-06-10 DIAGNOSIS — J3081 Allergic rhinitis due to animal (cat) (dog) hair and dander: Secondary | ICD-10-CM | POA: Diagnosis not present

## 2023-06-10 DIAGNOSIS — J3089 Other allergic rhinitis: Secondary | ICD-10-CM | POA: Diagnosis not present

## 2023-06-24 DIAGNOSIS — J3089 Other allergic rhinitis: Secondary | ICD-10-CM | POA: Diagnosis not present

## 2023-06-24 DIAGNOSIS — J301 Allergic rhinitis due to pollen: Secondary | ICD-10-CM | POA: Diagnosis not present

## 2023-06-24 DIAGNOSIS — J3081 Allergic rhinitis due to animal (cat) (dog) hair and dander: Secondary | ICD-10-CM | POA: Diagnosis not present

## 2023-07-01 DIAGNOSIS — J3081 Allergic rhinitis due to animal (cat) (dog) hair and dander: Secondary | ICD-10-CM | POA: Diagnosis not present

## 2023-07-01 DIAGNOSIS — J301 Allergic rhinitis due to pollen: Secondary | ICD-10-CM | POA: Diagnosis not present

## 2023-07-01 DIAGNOSIS — J3089 Other allergic rhinitis: Secondary | ICD-10-CM | POA: Diagnosis not present

## 2023-07-15 DIAGNOSIS — J301 Allergic rhinitis due to pollen: Secondary | ICD-10-CM | POA: Diagnosis not present

## 2023-07-15 DIAGNOSIS — J3081 Allergic rhinitis due to animal (cat) (dog) hair and dander: Secondary | ICD-10-CM | POA: Diagnosis not present

## 2023-07-15 DIAGNOSIS — J3089 Other allergic rhinitis: Secondary | ICD-10-CM | POA: Diagnosis not present

## 2023-07-29 DIAGNOSIS — J301 Allergic rhinitis due to pollen: Secondary | ICD-10-CM | POA: Diagnosis not present

## 2023-07-29 DIAGNOSIS — J3089 Other allergic rhinitis: Secondary | ICD-10-CM | POA: Diagnosis not present

## 2023-07-29 DIAGNOSIS — J3081 Allergic rhinitis due to animal (cat) (dog) hair and dander: Secondary | ICD-10-CM | POA: Diagnosis not present

## 2023-08-12 DIAGNOSIS — J301 Allergic rhinitis due to pollen: Secondary | ICD-10-CM | POA: Diagnosis not present

## 2023-08-12 DIAGNOSIS — J3089 Other allergic rhinitis: Secondary | ICD-10-CM | POA: Diagnosis not present

## 2023-08-12 DIAGNOSIS — J3081 Allergic rhinitis due to animal (cat) (dog) hair and dander: Secondary | ICD-10-CM | POA: Diagnosis not present

## 2023-08-26 DIAGNOSIS — J301 Allergic rhinitis due to pollen: Secondary | ICD-10-CM | POA: Diagnosis not present

## 2023-08-26 DIAGNOSIS — J3089 Other allergic rhinitis: Secondary | ICD-10-CM | POA: Diagnosis not present

## 2023-08-26 DIAGNOSIS — J3081 Allergic rhinitis due to animal (cat) (dog) hair and dander: Secondary | ICD-10-CM | POA: Diagnosis not present

## 2023-09-07 ENCOUNTER — Encounter: Payer: BC Managed Care – PPO | Admitting: Medical-Surgical

## 2023-09-09 DIAGNOSIS — J3089 Other allergic rhinitis: Secondary | ICD-10-CM | POA: Diagnosis not present

## 2023-09-09 DIAGNOSIS — J301 Allergic rhinitis due to pollen: Secondary | ICD-10-CM | POA: Diagnosis not present

## 2023-09-09 DIAGNOSIS — J3081 Allergic rhinitis due to animal (cat) (dog) hair and dander: Secondary | ICD-10-CM | POA: Diagnosis not present

## 2023-09-14 ENCOUNTER — Ambulatory Visit (INDEPENDENT_AMBULATORY_CARE_PROVIDER_SITE_OTHER): Payer: BC Managed Care – PPO | Admitting: Medical-Surgical

## 2023-09-14 ENCOUNTER — Encounter: Payer: Self-pay | Admitting: Medical-Surgical

## 2023-09-14 VITALS — BP 128/70 | HR 72 | Resp 20 | Ht 63.0 in | Wt 127.7 lb

## 2023-09-14 DIAGNOSIS — E559 Vitamin D deficiency, unspecified: Secondary | ICD-10-CM | POA: Insufficient documentation

## 2023-09-14 DIAGNOSIS — R400 Somnolence: Secondary | ICD-10-CM

## 2023-09-14 DIAGNOSIS — N3020 Other chronic cystitis without hematuria: Secondary | ICD-10-CM | POA: Insufficient documentation

## 2023-09-14 DIAGNOSIS — R208 Other disturbances of skin sensation: Secondary | ICD-10-CM

## 2023-09-14 DIAGNOSIS — Z0000 Encounter for general adult medical examination without abnormal findings: Secondary | ICD-10-CM

## 2023-09-14 DIAGNOSIS — Z8639 Personal history of other endocrine, nutritional and metabolic disease: Secondary | ICD-10-CM | POA: Insufficient documentation

## 2023-09-14 DIAGNOSIS — Z79890 Hormone replacement therapy (postmenopausal): Secondary | ICD-10-CM | POA: Insufficient documentation

## 2023-09-14 DIAGNOSIS — L989 Disorder of the skin and subcutaneous tissue, unspecified: Secondary | ICD-10-CM

## 2023-09-14 DIAGNOSIS — R5383 Other fatigue: Secondary | ICD-10-CM

## 2023-09-14 DIAGNOSIS — H9192 Unspecified hearing loss, left ear: Secondary | ICD-10-CM

## 2023-09-14 DIAGNOSIS — N200 Calculus of kidney: Secondary | ICD-10-CM | POA: Insufficient documentation

## 2023-09-14 DIAGNOSIS — I700 Atherosclerosis of aorta: Secondary | ICD-10-CM | POA: Diagnosis not present

## 2023-09-14 NOTE — Patient Instructions (Signed)
 Preventive Care 43 Years and Older, Female Preventive care refers to lifestyle choices and visits with your health care provider that can promote health and wellness. Preventive care visits are also called wellness exams. What can I expect for my preventive care visit? Counseling Your health care provider may ask you questions about your: Medical history, including: Past medical problems. Family medical history. Pregnancy and menstrual history. History of falls. Current health, including: Memory and ability to understand (cognition). Emotional well-being. Home life and relationship well-being. Sexual activity and sexual health. Lifestyle, including: Alcohol, nicotine or tobacco, and drug use. Access to firearms. Diet, exercise, and sleep habits. Work and work Astronomer. Sunscreen use. Safety issues such as seatbelt and bike helmet use. Physical exam Your health care provider will check your: Height and weight. These may be used to calculate your BMI (body mass index). BMI is a measurement that tells if you are at a healthy weight. Waist circumference. This measures the distance around your waistline. This measurement also tells if you are at a healthy weight and may help predict your risk of certain diseases, such as type 2 diabetes and high blood pressure. Heart rate and blood pressure. Body temperature. Skin for abnormal spots. What immunizations do I need?  Vaccines are usually given at various ages, according to a schedule. Your health care provider will recommend vaccines for you based on your age, medical history, and lifestyle or other factors, such as travel or where you work. What tests do I need? Screening Your health care provider may recommend screening tests for certain conditions. This may include: Lipid and cholesterol levels. Hepatitis C test. Hepatitis B test. HIV (human immunodeficiency virus) test. STI (sexually transmitted infection) testing, if you are at  risk. Lung cancer screening. Colorectal cancer screening. Diabetes screening. This is done by checking your blood sugar (glucose) after you have not eaten for a while (fasting). Mammogram. Talk with your health care provider about how often you should have regular mammograms. BRCA-related cancer screening. This may be done if you have a family history of breast, ovarian, tubal, or peritoneal cancers. Bone density scan. This is done to screen for osteoporosis. Talk with your health care provider about your test results, treatment options, and if necessary, the need for more tests. Follow these instructions at home: Eating and drinking  Eat a diet that includes fresh fruits and vegetables, whole grains, lean protein, and low-fat dairy products. Limit your intake of foods with high amounts of sugar, saturated fats, and salt. Take vitamin and mineral supplements as recommended by your health care provider. Do not drink alcohol if your health care provider tells you not to drink. If you drink alcohol: Limit how much you have to 0-1 drink a day. Know how much alcohol is in your drink. In the [place] one drink equals one 12 oz bottle of beer (355 mL), one 5 oz glass of wine (148 mL), or one 1 oz glass of hard liquor (44 mL). Lifestyle Brush your teeth every morning and night with fluoride toothpaste. Floss one time each day. Exercise for at least 30 minutes 5 or more days each week. Do not use any products that contain nicotine or tobacco. These products include cigarettes, chewing tobacco, and vaping devices, such as e-cigarettes. If you need help quitting, ask your health care provider. Do not use drugs. If you are sexually active, practice safe sex. Use a condom or other form of protection in order to prevent STIs. Take aspirin only as told by  your health care provider. Make sure that you understand how much to take and what form to take. Work with your health care provider to find out whether it  is safe and beneficial for you to take aspirin daily. Ask your health care provider if you need to take a cholesterol-lowering medicine (statin). Find healthy ways to manage stress, such as: Meditation, yoga, or listening to music. Journaling. Talking to a trusted person. Spending time with friends and family. Minimize exposure to UV radiation to reduce your risk of skin cancer. Safety Always wear your seat belt while driving or riding in a vehicle. Do not drive: If you have been drinking alcohol. Do not ride with someone who has been drinking. When you are tired or distracted. While texting. If you have been using any mind-altering substances or drugs. Wear a helmet and other protective equipment during sports activities. If you have firearms in your house, make sure you follow all gun safety procedures. What's next? Visit your health care provider once a year for an annual wellness visit. Ask your health care provider how often you should have your eyes and teeth checked. Stay up to date on all vaccines. This information is not intended to replace advice given to you by your health care provider. Make sure you discuss any questions you have with your health care provider. Document Revised: 09/03/2020 Document Reviewed: 09/03/2020 Elsevier Patient Education  2024 [employer].

## 2023-09-14 NOTE — Progress Notes (Signed)
 Complete physical exam  Patient: [name]   DOB: [birth year]   68 y.o. Female  MRN: [id number]  Subjective:    Chief Complaint  Patient presents with   Annual Exam   [name] is a 68 y.o. female who presents today for a complete physical exam. She reports consuming a general diet. Has been trying to walk more but this is limited by foot pain.  She generally feels fairly well. She reports sleeping fairly well. She does have additional problems to discuss today.    Most recent fall risk assessment:    09/06/2022   10:36 AM  Fall Risk   Falls in the past year? 0  Number falls in past yr: 0  Injury with Fall? 0  Risk for fall due to : No Fall Risks  Follow up Falls evaluation completed     Most recent depression screenings:    09/06/2022   10:36 AM 09/02/2021    8:40 AM  PHQ 2/9 Scores  PHQ - 2 Score 0 0    Vision:Within last year, Dental: No current dental problems and Receives regular dental care, and STD: The patient denies history of sexually transmitted disease.    Patient Care Team: Willo Mini, NP as PCP - General (Nurse Practitioner)   Outpatient Medications Prior to Visit  Medication Sig   atorvastatin  (LIPITOR) 10 MG tablet Take 1 tablet (10 mg total) by mouth daily.   azelastine (ASTELIN) 0.1 % nasal spray Place 1 spray into both nostrils daily.   B Complex Vitamins (B COMPLEX PO) Take 1 tablet by mouth daily.   Cholecalciferol 50 MCG (2000 UT) TABS Take 2,000 Units by mouth daily.   EPINEPHrine 0.3 mg/0.3 mL IJ SOAJ injection See admin instructions.   fluticasone (FLONASE) 50 MCG/ACT nasal spray Place 1 spray into both nostrils daily.   levocetirizine (XYZAL) 5 MG tablet Take 1 tablet by mouth daily.   montelukast (SINGULAIR) 10 MG tablet Take 1 tablet by mouth daily.   pregabalin (LYRICA) 75 MG capsule Take 75 mg by mouth daily.   VIVELLE-DOT 0.025 MG/24HR Place 1 patch onto the skin 2 (two) times a week.   [DISCONTINUED] methylPREDNISolone   (MEDROL  DOSEPAK) 4 MG TBPK tablet Take as directed.   No facility-administered medications prior to visit.    Review of Systems  Constitutional:  Positive for malaise/fatigue (excessive sleepiness). Negative for chills, fever and weight loss.  HENT:  Positive for hearing loss (left ear). Negative for congestion, ear pain, sinus pain and sore throat.   Eyes:  Negative for blurred vision, photophobia and pain.  Respiratory:  Negative for cough, shortness of breath and wheezing.   Cardiovascular:  Negative for chest pain, palpitations and leg swelling.  Gastrointestinal:  Negative for abdominal pain, constipation, diarrhea, heartburn, nausea and vomiting.  Genitourinary:  Negative for dysuria, frequency and urgency.  Musculoskeletal:  Negative for falls and neck pain.       Bilateral foot burning, mostly left but sometime right as well.  Skin:  Positive for rash (left lower leg). Negative for itching.  Neurological:  Positive for headaches (stress related). Negative for dizziness and weakness.  Endo/Heme/Allergies:  Negative for polydipsia. Does not bruise/bleed easily.  Psychiatric/Behavioral:  Negative for depression, substance abuse and suicidal ideas. The patient is not nervous/anxious.      Objective:     BP 128/70 (BP Location: Left Arm, Cuff Size: Normal)   Pulse 72   Resp 20   Ht 5' 3 (1.6 m)  Wt 127 lb 11.2 oz (57.9 kg)   SpO2 98%   BMI 22.62 kg/m    Physical Exam Constitutional:      General: She is not in acute distress.    Appearance: Normal appearance. She is not ill-appearing.  HENT:     Head: Normocephalic and atraumatic.     Right Ear: Tympanic membrane, ear canal and external ear normal. There is no impacted cerumen.     Left Ear: Tympanic membrane, ear canal and external ear normal. There is no impacted cerumen.     Nose: Nose normal. No congestion or rhinorrhea.     Mouth/Throat:     Mouth: Mucous membranes are moist.     Pharynx: No oropharyngeal exudate  or posterior oropharyngeal erythema.   Eyes:     General: No scleral icterus.       Right eye: No discharge.        Left eye: No discharge.     Extraocular Movements: Extraocular movements intact.     Conjunctiva/sclera: Conjunctivae normal.     Pupils: Pupils are equal, round, and reactive to light.   Neck:     Thyroid : No thyromegaly.     Vascular: No carotid bruit or JVD.     Trachea: Trachea normal.   Cardiovascular:     Rate and Rhythm: Normal rate and regular rhythm.     Pulses: Normal pulses.     Heart sounds: Normal heart sounds. No murmur heard.    No friction rub. No gallop.  Pulmonary:     Effort: Pulmonary effort is normal. No respiratory distress.     Breath sounds: Normal breath sounds. No wheezing.  Abdominal:     General: Bowel sounds are normal. There is no distension.     Palpations: Abdomen is soft.     Tenderness: There is no abdominal tenderness. There is no guarding.   Musculoskeletal:        General: Normal range of motion.     Cervical back: Normal range of motion and neck supple.  Lymphadenopathy:     Cervical: No cervical adenopathy.   Skin:    General: Skin is warm and dry.     Findings: Rash (see photo; Larger lesion slightly raised, blanchable.) present.   Neurological:     Mental Status: She is alert and oriented to person, place, and time.     Cranial Nerves: No cranial nerve deficit.   Psychiatric:        Mood and Affect: Mood normal.        Behavior: Behavior normal.        Thought Content: Thought content normal.        Judgment: Judgment normal.      No results found for any visits on 09/14/23.     Assessment & Plan:    Routine Health Maintenance and Physical Exam  Immunization History  Administered Date(s) Administered   Influenza, High Dose Seasonal PF 05/19/2020, 06/17/2021   Influenza, Quadrivalent, Recombinant, Inj, Pf 12/24/2016, 12/23/2017, 12/22/2018, 12/28/2019   Influenza,inj,Quad PF,6+ Mos 01/02/2021    Influenza-Unspecified 12/20/2017, 01/01/2022, 01/10/2023   PFIZER(Purple Top)SARS-COV-2 Vaccination 07/03/2020, 07/31/2020   Pneumococcal Conjugate-13 06/17/2017   Pneumococcal Polysaccharide-23 06/17/2017, 06/17/2021   Tdap 06/17/2017   Zoster Recombinant(Shingrix) 09/01/2020, 12/01/2020    Health Maintenance  Topic Date Due   COVID-19 Vaccine (3 - 2024-25 season) 09/30/2023 (Originally 11/21/2022)   INFLUENZA VACCINE  10/21/2023   MAMMOGRAM  10/28/2024   Colonoscopy  08/08/2026   DTaP/Tdap/Td (2 -  Td or Tdap) 06/18/2027   Pneumococcal Vaccine: 50+ Years  Completed   DEXA SCAN  Completed   Hepatitis C Screening  Completed   Zoster Vaccines- Shingrix  Completed   Hepatitis B Vaccines  Aged Out   HPV VACCINES  Aged Out   Meningococcal B Vaccine  Aged Out    Discussed health benefits of physical activity, and encouraged her to engage in regular exercise appropriate for her age and condition.  1. Annual physical exam (Primary) Checking labs as below. UTD on preventative care. Wellness information provided with AVS. - Lipid panel - CBC with Differential/Platelet - CMP14+EGFR  2. Aortic atherosclerosis (HCC) Check lipid panel today.  Continue atorvastatin  10 mg daily. - Lipid panel  3. Burning sensation of foot Unclear etiology.  Consider possible plantar fasciitis however it does not seem to be a problem early in the morning and only starts after she has been up and around for several hours.  No skin changes or swelling.  No abnormal findings on exam.  Plan to check labs as noted below.  If no abnormalities found to explain her symptoms on blood work, we may need to consider doing ABIs and/or nerve conduction study. - TSH - Lipid panel - CBC with Differential/Platelet - Hemoglobin A1c - CMP14+EGFR - Sed Rate (ESR) - C-reactive protein - Vitamin B12 - VITAMIN D 25 Hydroxy (Vit-D Deficiency, Fractures) - Iron, TIBC and Ferritin Panel  4. Fatigue, unspecified type 5. Daytime  sleepiness Reports that her fatigue has been profound and she is having a hard time keeping her eyes open if sitting down for more than a few minutes.  Gets adequate sleep at night and has not been able to find other explanation for her symptoms.  In addition to labs above, we will add evaluation for vitamin deficiency, iron, and cortisol.  STOP-BANG negative for concern for sleep apnea.  High score on Epworth sleepiness scale. - Vitamin B12 - VITAMIN D 25 Hydroxy (Vit-D Deficiency, Fractures) - Iron, TIBC and Ferritin Panel - Cortisol  6. Subjective hearing change of left ear Has had several months of difficulty with her left ear.  It got better however approximately once weekly, she experiences a feeling of her ear being clogged up with muffled hearing.  It only last about 1 day before spontaneously resolving.  Exam is normal with no findings to explain her symptoms.  Referring to ENT for further evaluation. - Ambulatory referral to ENT  7. Skin lesion Lesion on her left anterior lower leg likely eczema however the one slightly higher and lateral is blanchable and appears to be some sort of vascular malformation.  Suspect hemangioma however since she has multiple moles, lesion, and skin tags, referring to dermatology. - Ambulatory referral to Dermatology   Return in about 1 year (around 09/13/2024) for annual physical exam or sooner if needed.     Jamaica Inthavong, NP

## 2023-09-15 ENCOUNTER — Ambulatory Visit: Payer: Self-pay | Admitting: Medical-Surgical

## 2023-09-15 DIAGNOSIS — R7989 Other specified abnormal findings of blood chemistry: Secondary | ICD-10-CM

## 2023-09-15 LAB — LIPID PANEL
Chol/HDL Ratio: 2.4 ratio (ref 0.0–4.4)
Cholesterol, Total: 158 mg/dL (ref 100–199)
HDL: 65 mg/dL (ref >39)
LDL Chol Calc (NIH): 76 mg/dL (ref 0–99)
Triglycerides: 94 mg/dL (ref 0–149)
VLDL Cholesterol Cal: 17 mg/dL (ref 5–40)

## 2023-09-15 LAB — IRON,TIBC AND FERRITIN PANEL
Ferritin: 178 ng/mL — ABNORMAL HIGH (ref 15–150)
Iron Saturation: 34 % (ref 15–55)
Iron: 90 ug/dL (ref 27–139)
Total Iron Binding Capacity: 266 ug/dL (ref 250–450)
UIBC: 176 ug/dL (ref 118–369)

## 2023-09-15 LAB — CMP14+EGFR
ALT: 25 IU/L (ref 0–32)
AST: 28 IU/L (ref 0–40)
Albumin: 4.5 g/dL (ref 3.9–4.9)
Alkaline Phosphatase: 79 IU/L (ref 44–121)
BUN/Creatinine Ratio: 18 (ref 12–28)
BUN: 16 mg/dL (ref 8–27)
Bilirubin Total: 0.8 mg/dL (ref 0.0–1.2)
CO2: 18 mmol/L — ABNORMAL LOW (ref 20–29)
Calcium: 9.7 mg/dL (ref 8.7–10.3)
Chloride: 105 mmol/L (ref 96–106)
Creatinine, Ser: 0.88 mg/dL (ref 0.57–1.00)
Globulin, Total: 2.5 g/dL (ref 1.5–4.5)
Glucose: 89 mg/dL (ref 70–99)
Potassium: 4.2 mmol/L (ref 3.5–5.2)
Sodium: 143 mmol/L (ref 134–144)
Total Protein: 7 g/dL (ref 6.0–8.5)
eGFR: 72 mL/min/{1.73_m2} (ref >59)

## 2023-09-15 LAB — SEDIMENTATION RATE: Sed Rate: 4 mm/h (ref 0–40)

## 2023-09-15 LAB — CBC WITH DIFFERENTIAL/PLATELET
Basophils Absolute: 0.1 10*3/uL (ref 0.0–0.2)
Basos: 1 %
EOS (ABSOLUTE): 0.1 10*3/uL (ref 0.0–0.4)
Eos: 1 %
Hematocrit: 42.9 % (ref 34.0–46.6)
Hemoglobin: 13.8 g/dL (ref 11.1–15.9)
Immature Grans (Abs): 0.1 10*3/uL (ref 0.0–0.1)
Immature Granulocytes: 1 %
Lymphocytes Absolute: 2.6 10*3/uL (ref 0.7–3.1)
Lymphs: 28 %
MCH: 29.3 pg (ref 26.6–33.0)
MCHC: 32.2 g/dL (ref 31.5–35.7)
MCV: 91 fL (ref 79–97)
Monocytes Absolute: 0.6 10*3/uL (ref 0.1–0.9)
Monocytes: 6 %
Neutrophils Absolute: 6 10*3/uL (ref 1.4–7.0)
Neutrophils: 63 %
Platelets: 309 10*3/uL (ref 150–450)
RBC: 4.71 x10E6/uL (ref 3.77–5.28)
RDW: 11.8 % (ref 11.7–15.4)
WBC: 9.4 10*3/uL (ref 3.4–10.8)

## 2023-09-15 LAB — C-REACTIVE PROTEIN: CRP: <1 mg/L (ref 0–10)

## 2023-09-15 LAB — HEMOGLOBIN A1C
Est. average glucose Bld gHb Est-mCnc: 105 mg/dL
Hgb A1c MFr Bld: 5.3 % (ref 4.8–5.6)

## 2023-09-15 LAB — CORTISOL: Cortisol: 10.6 ug/dL (ref 6.2–19.4)

## 2023-09-15 LAB — TSH: TSH: 2.54 u[IU]/mL (ref 0.450–4.500)

## 2023-09-15 LAB — VITAMIN D 25 HYDROXY (VIT D DEFICIENCY, FRACTURES): Vit D, 25-Hydroxy: 55.9 ng/mL (ref 30.0–100.0)

## 2023-09-15 LAB — VITAMIN B12: Vitamin B-12: 1281 pg/mL — ABNORMAL HIGH (ref 232–1245)

## 2023-09-16 DIAGNOSIS — J301 Allergic rhinitis due to pollen: Secondary | ICD-10-CM | POA: Diagnosis not present

## 2023-09-16 DIAGNOSIS — J3089 Other allergic rhinitis: Secondary | ICD-10-CM | POA: Diagnosis not present

## 2023-09-16 DIAGNOSIS — J3081 Allergic rhinitis due to animal (cat) (dog) hair and dander: Secondary | ICD-10-CM | POA: Diagnosis not present

## 2023-09-21 ENCOUNTER — Other Ambulatory Visit: Payer: Self-pay | Admitting: Medical-Surgical

## 2023-09-21 DIAGNOSIS — Z1231 Encounter for screening mammogram for malignant neoplasm of breast: Secondary | ICD-10-CM

## 2023-09-22 DIAGNOSIS — R7989 Other specified abnormal findings of blood chemistry: Secondary | ICD-10-CM | POA: Diagnosis not present

## 2023-09-23 LAB — SPECIMEN STATUS REPORT

## 2023-09-23 LAB — SEDIMENTATION RATE: Sed Rate: 5 mm/h (ref 0–40)

## 2023-09-23 LAB — C-REACTIVE PROTEIN: CRP: <1 mg/L (ref 0–10)

## 2023-09-30 DIAGNOSIS — J3089 Other allergic rhinitis: Secondary | ICD-10-CM | POA: Diagnosis not present

## 2023-09-30 DIAGNOSIS — J3081 Allergic rhinitis due to animal (cat) (dog) hair and dander: Secondary | ICD-10-CM | POA: Diagnosis not present

## 2023-09-30 DIAGNOSIS — J301 Allergic rhinitis due to pollen: Secondary | ICD-10-CM | POA: Diagnosis not present

## 2023-10-03 DIAGNOSIS — C44719 Basal cell carcinoma of skin of left lower limb, including hip: Secondary | ICD-10-CM | POA: Diagnosis not present

## 2023-10-03 DIAGNOSIS — D485 Neoplasm of uncertain behavior of skin: Secondary | ICD-10-CM | POA: Diagnosis not present

## 2023-10-14 DIAGNOSIS — J3089 Other allergic rhinitis: Secondary | ICD-10-CM | POA: Diagnosis not present

## 2023-10-14 DIAGNOSIS — J3081 Allergic rhinitis due to animal (cat) (dog) hair and dander: Secondary | ICD-10-CM | POA: Diagnosis not present

## 2023-10-14 DIAGNOSIS — J301 Allergic rhinitis due to pollen: Secondary | ICD-10-CM | POA: Diagnosis not present

## 2023-10-21 DIAGNOSIS — H6993 Unspecified Eustachian tube disorder, bilateral: Secondary | ICD-10-CM | POA: Diagnosis not present

## 2023-10-21 DIAGNOSIS — H903 Sensorineural hearing loss, bilateral: Secondary | ICD-10-CM | POA: Diagnosis not present

## 2023-10-24 DIAGNOSIS — C44719 Basal cell carcinoma of skin of left lower limb, including hip: Secondary | ICD-10-CM | POA: Diagnosis not present

## 2023-10-28 DIAGNOSIS — J301 Allergic rhinitis due to pollen: Secondary | ICD-10-CM | POA: Diagnosis not present

## 2023-10-28 DIAGNOSIS — J3089 Other allergic rhinitis: Secondary | ICD-10-CM | POA: Diagnosis not present

## 2023-10-28 DIAGNOSIS — J3081 Allergic rhinitis due to animal (cat) (dog) hair and dander: Secondary | ICD-10-CM | POA: Diagnosis not present

## 2023-11-04 ENCOUNTER — Ambulatory Visit
Admission: RE | Admit: 2023-11-04 | Discharge: 2023-11-04 | Disposition: A | Discharge status: Home / Self Care | Source: Ambulatory Visit | Attending: Medical-Surgical | Admitting: Medical-Surgical

## 2023-11-04 DIAGNOSIS — Z1231 Encounter for screening mammogram for malignant neoplasm of breast: Secondary | ICD-10-CM

## 2023-11-08 ENCOUNTER — Ambulatory Visit: Payer: Self-pay | Admitting: Medical-Surgical

## 2023-11-11 DIAGNOSIS — J3081 Allergic rhinitis due to animal (cat) (dog) hair and dander: Secondary | ICD-10-CM | POA: Diagnosis not present

## 2023-11-11 DIAGNOSIS — J301 Allergic rhinitis due to pollen: Secondary | ICD-10-CM | POA: Diagnosis not present

## 2023-11-11 DIAGNOSIS — J3089 Other allergic rhinitis: Secondary | ICD-10-CM | POA: Diagnosis not present

## 2023-11-25 DIAGNOSIS — J3089 Other allergic rhinitis: Secondary | ICD-10-CM | POA: Diagnosis not present

## 2023-11-25 DIAGNOSIS — J301 Allergic rhinitis due to pollen: Secondary | ICD-10-CM | POA: Diagnosis not present

## 2023-11-25 DIAGNOSIS — J3081 Allergic rhinitis due to animal (cat) (dog) hair and dander: Secondary | ICD-10-CM | POA: Diagnosis not present

## 2023-12-09 DIAGNOSIS — J3081 Allergic rhinitis due to animal (cat) (dog) hair and dander: Secondary | ICD-10-CM | POA: Diagnosis not present

## 2023-12-09 DIAGNOSIS — J3089 Other allergic rhinitis: Secondary | ICD-10-CM | POA: Diagnosis not present

## 2023-12-09 DIAGNOSIS — J301 Allergic rhinitis due to pollen: Secondary | ICD-10-CM | POA: Diagnosis not present

## 2023-12-23 DIAGNOSIS — J3089 Other allergic rhinitis: Secondary | ICD-10-CM | POA: Diagnosis not present

## 2023-12-23 DIAGNOSIS — J3081 Allergic rhinitis due to animal (cat) (dog) hair and dander: Secondary | ICD-10-CM | POA: Diagnosis not present

## 2023-12-23 DIAGNOSIS — J301 Allergic rhinitis due to pollen: Secondary | ICD-10-CM | POA: Diagnosis not present

## 2024-01-06 DIAGNOSIS — J3081 Allergic rhinitis due to animal (cat) (dog) hair and dander: Secondary | ICD-10-CM | POA: Diagnosis not present

## 2024-01-06 DIAGNOSIS — J301 Allergic rhinitis due to pollen: Secondary | ICD-10-CM | POA: Diagnosis not present

## 2024-01-06 DIAGNOSIS — J3089 Other allergic rhinitis: Secondary | ICD-10-CM | POA: Diagnosis not present

## 2024-01-09 DIAGNOSIS — J3081 Allergic rhinitis due to animal (cat) (dog) hair and dander: Secondary | ICD-10-CM | POA: Diagnosis not present

## 2024-01-09 DIAGNOSIS — J301 Allergic rhinitis due to pollen: Secondary | ICD-10-CM | POA: Diagnosis not present

## 2024-01-09 DIAGNOSIS — J3089 Other allergic rhinitis: Secondary | ICD-10-CM | POA: Diagnosis not present

## 2024-01-10 DIAGNOSIS — J3089 Other allergic rhinitis: Secondary | ICD-10-CM | POA: Diagnosis not present

## 2024-01-10 DIAGNOSIS — J3081 Allergic rhinitis due to animal (cat) (dog) hair and dander: Secondary | ICD-10-CM | POA: Diagnosis not present

## 2024-01-10 DIAGNOSIS — J301 Allergic rhinitis due to pollen: Secondary | ICD-10-CM | POA: Diagnosis not present

## 2024-01-20 DIAGNOSIS — J3081 Allergic rhinitis due to animal (cat) (dog) hair and dander: Secondary | ICD-10-CM | POA: Diagnosis not present

## 2024-01-20 DIAGNOSIS — J301 Allergic rhinitis due to pollen: Secondary | ICD-10-CM | POA: Diagnosis not present

## 2024-01-20 DIAGNOSIS — J3089 Other allergic rhinitis: Secondary | ICD-10-CM | POA: Diagnosis not present

## 2024-01-27 DIAGNOSIS — J3081 Allergic rhinitis due to animal (cat) (dog) hair and dander: Secondary | ICD-10-CM | POA: Diagnosis not present

## 2024-01-27 DIAGNOSIS — J301 Allergic rhinitis due to pollen: Secondary | ICD-10-CM | POA: Diagnosis not present

## 2024-01-27 DIAGNOSIS — Z79899 Other long term (current) drug therapy: Secondary | ICD-10-CM | POA: Diagnosis not present

## 2024-01-27 DIAGNOSIS — J3089 Other allergic rhinitis: Secondary | ICD-10-CM | POA: Diagnosis not present

## 2024-02-05 ENCOUNTER — Other Ambulatory Visit: Payer: Self-pay | Admitting: Medical-Surgical

## 2024-02-10 DIAGNOSIS — J3089 Other allergic rhinitis: Secondary | ICD-10-CM | POA: Diagnosis not present

## 2024-02-10 DIAGNOSIS — J3081 Allergic rhinitis due to animal (cat) (dog) hair and dander: Secondary | ICD-10-CM | POA: Diagnosis not present

## 2024-02-10 DIAGNOSIS — J301 Allergic rhinitis due to pollen: Secondary | ICD-10-CM | POA: Diagnosis not present

## 2024-02-24 DIAGNOSIS — J3081 Allergic rhinitis due to animal (cat) (dog) hair and dander: Secondary | ICD-10-CM | POA: Diagnosis not present

## 2024-02-24 DIAGNOSIS — J3089 Other allergic rhinitis: Secondary | ICD-10-CM | POA: Diagnosis not present

## 2024-02-24 DIAGNOSIS — J301 Allergic rhinitis due to pollen: Secondary | ICD-10-CM | POA: Diagnosis not present

## 2024-09-14 ENCOUNTER — Encounter: Admitting: Medical-Surgical
